# Patient Record
Sex: Female | Born: 1945
Health system: Southern US, Community
[De-identification: ages and names within clinical notes are randomized; demographics above are authoritative.]

## PROBLEM LIST (undated history)

## (undated) DIAGNOSIS — G4730 Sleep apnea, unspecified: Secondary | ICD-10-CM

## (undated) DIAGNOSIS — K2100 Gastro-esophageal reflux disease with esophagitis, without bleeding: Secondary | ICD-10-CM

## (undated) DIAGNOSIS — I10 Essential (primary) hypertension: Secondary | ICD-10-CM

## (undated) DIAGNOSIS — E785 Hyperlipidemia, unspecified: Secondary | ICD-10-CM

## (undated) DIAGNOSIS — C801 Malignant (primary) neoplasm, unspecified: Secondary | ICD-10-CM

## (undated) DIAGNOSIS — K219 Gastro-esophageal reflux disease without esophagitis: Secondary | ICD-10-CM

## (undated) DIAGNOSIS — E538 Deficiency of other specified B group vitamins: Secondary | ICD-10-CM

## (undated) DIAGNOSIS — K5190 Ulcerative colitis, unspecified, without complications: Secondary | ICD-10-CM

## (undated) DIAGNOSIS — T7840XA Allergy, unspecified, initial encounter: Secondary | ICD-10-CM

## (undated) HISTORY — PX: NASAL SINUS SURGERY: SHX719

## (undated) HISTORY — PX: COLONOSCOPY: SHX174

## (undated) HISTORY — PX: ABDOMINAL HYSTERECTOMY: SHX81

## (undated) HISTORY — PX: ESOPHAGOGASTRODUODENOSCOPY: SHX1529

## (undated) HISTORY — PX: CATARACT EXTRACTION: SUR2

## (undated) HISTORY — PX: SIGMOIDOSCOPY: SUR1295

## (undated) HISTORY — PX: POLYPECTOMY: SHX149

---

## 2004-01-27 ENCOUNTER — Ambulatory Visit: Payer: Self-pay | Admitting: Internal Medicine

## 2004-11-10 ENCOUNTER — Ambulatory Visit: Payer: Self-pay | Admitting: Unknown Physician Specialty

## 2009-07-15 ENCOUNTER — Ambulatory Visit: Payer: Self-pay | Admitting: Internal Medicine

## 2010-04-07 ENCOUNTER — Ambulatory Visit: Payer: Self-pay | Admitting: Unknown Physician Specialty

## 2010-04-10 LAB — PATHOLOGY REPORT

## 2010-05-02 ENCOUNTER — Other Ambulatory Visit: Payer: Self-pay | Admitting: Unknown Physician Specialty

## 2010-07-21 ENCOUNTER — Ambulatory Visit: Payer: Self-pay | Admitting: Internal Medicine

## 2011-08-17 ENCOUNTER — Ambulatory Visit: Payer: Self-pay | Admitting: Internal Medicine

## 2011-10-18 ENCOUNTER — Observation Stay: Payer: Self-pay | Admitting: Internal Medicine

## 2011-10-18 LAB — CK TOTAL AND CKMB (NOT AT ARMC)
CK, Total: 99 U/L (ref 21–215)
CK-MB: 1.6 ng/mL (ref 0.5–3.6)

## 2011-10-18 LAB — URINALYSIS, COMPLETE
Bacteria: NONE SEEN
Bilirubin,UR: NEGATIVE
Glucose,UR: NEGATIVE mg/dL (ref 0–75)
Leukocyte Esterase: NEGATIVE
Protein: NEGATIVE
RBC,UR: 1 /HPF (ref 0–5)
Squamous Epithelial: <1
WBC UR: <1 /HPF (ref 0–5)

## 2011-10-18 LAB — COMPREHENSIVE METABOLIC PANEL
Albumin: 3.6 g/dL (ref 3.4–5.0)
Alkaline Phosphatase: 57 U/L (ref 50–136)
BUN: 9 mg/dL (ref 7–18)
Bilirubin,Total: 0.4 mg/dL (ref 0.2–1.0)
Calcium, Total: 8.9 mg/dL (ref 8.5–10.1)
Chloride: 98 mmol/L (ref 98–107)
Creatinine: 0.7 mg/dL (ref 0.60–1.30)
EGFR (African American): >60
Glucose: 105 mg/dL — ABNORMAL HIGH (ref 65–99)
Potassium: 3 mmol/L — ABNORMAL LOW (ref 3.5–5.1)
SGOT(AST): 23 U/L (ref 15–37)
SGPT (ALT): 16 U/L (ref 12–78)
Sodium: 135 mmol/L — ABNORMAL LOW (ref 136–145)
Total Protein: 7.1 g/dL (ref 6.4–8.2)

## 2011-10-18 LAB — CBC
HCT: 37.5 % (ref 35.0–47.0)
HGB: 13.3 g/dL (ref 12.0–16.0)
MCH: 32.3 pg (ref 26.0–34.0)
MCHC: 35.4 g/dL (ref 32.0–36.0)
MCV: 91 fL (ref 80–100)
RDW: 12.5 % (ref 11.5–14.5)

## 2011-10-18 LAB — MAGNESIUM: Magnesium: 1.7 mg/dL — ABNORMAL LOW

## 2011-10-18 LAB — TROPONIN I: Troponin-I: <0.02 ng/mL

## 2011-10-19 LAB — HEMOGLOBIN A1C: Hemoglobin A1C: 5.7 %

## 2011-10-19 LAB — CBC WITH DIFFERENTIAL/PLATELET
Basophil #: 0.1 10*3/uL (ref 0.0–0.1)
Basophil %: 0.7 %
Eosinophil #: 0.1 10*3/uL (ref 0.0–0.7)
Eosinophil %: 1 %
Lymphocyte #: 1.5 10*3/uL (ref 1.0–3.6)
Lymphocyte %: 19 %
MCH: 31.2 pg (ref 26.0–34.0)
MCHC: 34 g/dL (ref 32.0–36.0)
MCV: 92 fL (ref 80–100)
Platelet: 258 10*3/uL (ref 150–440)
RBC: 4.14 10*6/uL (ref 3.80–5.20)
RDW: 12.6 % (ref 11.5–14.5)

## 2011-10-19 LAB — LIPID PANEL
Cholesterol: 184 mg/dL (ref 0–200)
HDL Cholesterol: 72 mg/dL — ABNORMAL HIGH (ref 40–60)
Ldl Cholesterol, Calc: 102 mg/dL — ABNORMAL HIGH (ref 0–100)
Triglycerides: 48 mg/dL (ref 0–200)

## 2011-10-19 LAB — TROPONIN I
Troponin-I: 0.09 ng/mL — ABNORMAL HIGH
Troponin-I: 0.04 ng/mL

## 2011-10-19 LAB — COMPREHENSIVE METABOLIC PANEL
Albumin: 3.2 g/dL — ABNORMAL LOW (ref 3.4–5.0)
Alkaline Phosphatase: 51 U/L (ref 50–136)
BUN: 8 mg/dL (ref 7–18)
Bilirubin,Total: 0.5 mg/dL (ref 0.2–1.0)
Co2: 30 mmol/L (ref 21–32)
Creatinine: 0.73 mg/dL (ref 0.60–1.30)
EGFR (African American): >60
EGFR (Non-African Amer.): >60
Glucose: 101 mg/dL — ABNORMAL HIGH (ref 65–99)
Osmolality: 272 (ref 275–301)
Potassium: 3.7 mmol/L (ref 3.5–5.1)
SGPT (ALT): 15 U/L (ref 12–78)
Total Protein: 6.7 g/dL (ref 6.4–8.2)

## 2011-10-19 LAB — CK TOTAL AND CKMB (NOT AT ARMC)
CK, Total: 78 U/L
CK, Total: 82 U/L
CK-MB: 2.1 ng/mL
CK-MB: 1.6 ng/mL

## 2011-10-19 LAB — PROTIME-INR: Prothrombin Time: 13.2 secs (ref 11.5–14.7)

## 2012-08-22 ENCOUNTER — Ambulatory Visit: Payer: Self-pay | Admitting: Internal Medicine

## 2013-07-31 DIAGNOSIS — K2100 Gastro-esophageal reflux disease with esophagitis, without bleeding: Secondary | ICD-10-CM | POA: Insufficient documentation

## 2013-07-31 DIAGNOSIS — K5190 Ulcerative colitis, unspecified, without complications: Secondary | ICD-10-CM | POA: Insufficient documentation

## 2013-08-28 ENCOUNTER — Ambulatory Visit: Payer: Self-pay | Admitting: Internal Medicine

## 2014-02-11 DIAGNOSIS — E538 Deficiency of other specified B group vitamins: Secondary | ICD-10-CM | POA: Insufficient documentation

## 2014-04-28 ENCOUNTER — Telehealth: Payer: Self-pay | Admitting: *Deleted

## 2014-04-28 NOTE — Telephone Encounter (Signed)
Patient called needing information on repairing her orthotics.  I explained that there is a $90 charge to refurbish she stated that Dr Milinda Pointer told her that they should last 4-5 years and that it has only been around 2.  I explained that we all wear differently and some are much harder than others so it is not unusual for the coverings to wear at a different rate the actual support itself should last as Dr Milinda Pointer stated.  She said it is not wear that the bottom is actually coming off and she was considering gluing it back on, I told her that did not sound like normal wear and tear.  I reccommended she bring the orthotics in for Dr Milinda Pointer to examine and see if it is a manufactures defect.  Either way she would like to purchase a pair of power steps size 9 to wear while her custom molded are being repaired.  She will go to the [place] office at her convenience.

## 2014-05-04 DIAGNOSIS — R52 Pain, unspecified: Secondary | ICD-10-CM

## 2014-05-19 ENCOUNTER — Telehealth: Payer: Self-pay | Admitting: Podiatry

## 2014-05-19 NOTE — Telephone Encounter (Signed)
Spoke with patient about orthos. Patient knows that recover costs $90. Will pick up orthos Friday

## 2014-05-25 DIAGNOSIS — M722 Plantar fascial fibromatosis: Secondary | ICD-10-CM

## 2014-06-29 NOTE — Consult Note (Signed)
PATIENT NAME:  Patricia Haynes, Patricia Haynes MR#:  [id number] DATE OF BIRTH:  1945/10/11  DATE OF CONSULTATION:  10/19/2011  REFERRING PHYSICIAN:  Dr. Allyson Sabal CONSULTING PHYSICIAN:  Rudell Cobb. Loletta Specter, MD  HISTORY:   Ms. Manzer is a 69 year old right-handed white patient of Dr. Emily Filbert with history of ulcerative colitis, hypertension, allergic rhinitis, and acid reflux who was admitted 10/18/2011 and is referred for evaluation of facial droop. History comes from the patient and from her hospital chart.   The patient presented to the emergency room at 6 p.m. on 10/18/2011 from [facility] where she had been directed with regard to recent visual symptoms. She reports that she was in her usual state of health until the afternoon of 10/17/2011 when she had the onset of right eye irritation. At suppertime she noted change of taste, more bland on the right side. At about 9:30 p.m. while flossing her teeth and rinsing her mouth, she noted leak of water from the right side of her mouth and asymmetry of her face in the mirror. Her symptoms persisted on the eighth  so that she contacted Dr. Ammie Ferrier office who directed her to be seen at Urgent Care or the ER, eventually resulting in ER evaluation and admission. She reports today that her symptoms have persisted and that weakness of the right side of the face is perhaps a little worse than it was on the eighth. She denies noting change of her hearing. She denied headache but then reported that she had some aching pain below and behind the right ear this morning. She has had no prior similar symptoms. She denies history of episodes in the past suggestive of stroke or temporary stroke. She denied any seizure or meningitis and does not have  migraine headaches.   EXAMINATION: The patient is a well-developed, well-nourished white woman who was pleasant and cooperative in no apparent distress with blood pressure semisupine 140/80 and heart rate 58. There was no fever. She was  normocephalic without evidence of trauma and her neck was supple. Mental status was normal including clear speech and normal expression. She was lucid and a good historian with normal affect.   On cranial nerve examination, she was seen to have moderate or moderate to severe lower motor neuron right facial weakness. She was able to just fully cover the globe of the right eye when asked to close her eyes firmly. Facial sensation was normal. Eye movements were normal. Visual fields were full to finger count for each eye; visual acuity was not tested.  Hearing acuity was normal to finger rubs bilaterally. There was hyperacusis of the right ear when she was tested with loud hand clap. Examination of the oropharynx was normal including midline uvula elevation with phonation and appearance of tongue and tongue movement.   Motor examination of the extremities showed normal tone and bulk throughout and symmetric full power throughout. Extremity coordination was normal as was extremity sensory exam. Reflexes were symmetric and rated 1+ throughout.   IMPRESSION: Clinical picture is most consistent with idiopathic Bell's palsy.   RECOMMENDATIONS:  1. Consider prednisone taper.  2. The patient is instructed in taping the right eyelid closed at night.  3. I do not see indication for lumbar puncture.   I appreciate being asked to see this pleasant and interesting lady.    ____________________________ Rudell Cobb. Loletta Specter, MD prc:bjt D: 10/19/2011 15:52:10 ET T: 10/19/2011 16:42:35 ET JOB#: [id number]  cc: Rudell Cobb. Loletta Specter, MD, <Dictator> Linton Flemings MD  ELECTRONICALLY SIGNED 10/25/2011 13:14

## 2014-06-29 NOTE — Discharge Summary (Signed)
PATIENT NAME:  Patricia Haynes, Patricia Haynes MR#:  [id number] DATE OF BIRTH:  02-24-1946  DATE OF ADMISSION:  10/18/2011 DATE OF DISCHARGE:  10/19/2011  DISCHARGE DIAGNOSES:  1. Malignant hypertension.  2. Bell's palsy.  3. Malignant hypertension. 4. Ulcerative colitis flare.   DISCHARGE MEDICATIONS:  1. Prednisone taper.  2. Omeprazole 20 mg daily.  3. Losartan/HCT 100/25 mg daily.  4. Potassium chloride 10 mEq daily.  5. Delzicol 400 mg three times daily. 6. Aspirin 81 mg daily.  7. Premarin 0.625 mg every other day.  8. Vitamin D3 daily.  9. Alprazolam 0.5 mg daily p.r.n.  10. Pravastatin 40 mg at bedtime.  11. Clonazepam 0.5 mg at bedtime.   REASON FOR ADMISSION: This is a 69 year old female who presents with right facial droop and elevated blood pressures. Please see the History and Physical for history of present illness, past medical history, and physical exam.   HOSPITAL COURSE: The patient was admitted and it became more apparent that she had Bell's palsy. Brain MRI was unremarkable. Carotid Doppler was unremarkable. She received Solu-Medrol x1 and will be on a prednisone taper with follow-up with Dr. Sabra Heck early on Tuesday of       this coming week. Blood pressure controlled with IV hydralazine. She can double up her Losartan if her blood pressure were to go up at home. Overall prognosis is good. The prednisone will help her ulcerative colitis flares as well.   ____________________________ Rusty Aus, MD mfm:slb D: 10/19/2011 17:28:17 ET T: 10/20/2011 15:09:47 ET JOB#: [id number]  cc: Rusty Aus, MD, <Dictator> MARK Roselee Culver MD ELECTRONICALLY SIGNED 10/22/2011 8:18

## 2014-06-29 NOTE — H&P (Signed)
PATIENT NAME:  Patricia Haynes, Patricia Haynes MR#:  [id number] DATE OF BIRTH:  30-Dec-1945  DATE OF ADMISSION:  10/18/2011  PRIMARY CARE PHYSICIAN: Dr. Emily Filbert  CHIEF COMPLAINT: Facial droop.   SUBJECTIVE: This is a 69 year old female with history of hypertension who presents with right-sided facial droop. The patient has been having some tearing and watering of her eyes and she has been unable to blink her right eye. She developed these symptoms in the afternoon. She also developed some blurry vision. No slurred speech. No unilateral weakness. No dysarthria. No dysphagia. She reportedly also had some fluttering sensation in her chest and was found to be fairly hypertensive with systolic blood pressure greater than 210 when she presented to the ER.   PAST MEDICAL HISTORY:  1. Hypertension.  2. Gastroesophageal reflux disease.  3. Ulcerative colitis.  4. Dyslipidemia.  5. Vitamin D deficiency.   PAST SURGICAL HISTORY:  1. History of colonoscopy.  2. Hysterectomy.   ALLERGIES: Codeine and latex.   SOCIAL HISTORY: The patient denies any history of smoking. Drinks occasionally and lives with her husband.   FAMILY HISTORY: Positive for mother having stroke as well as heart problems. Father had history of ulcerative colitis and colon cancer.   HOME MEDICATIONS:  1. Omeprazole 20 mg twice a day. 2. Valsartan/HCTZ 100/25, 1 tablet p.o. daily.  3. Vitamin B12 2000 mcg daily. 4. Klor-Con 10 mEq tablet daily.  5. Aspirin 81 mg p.o. daily.  6. Rowasa enema at night as needed. 7. Claritin 10 mg p.o. daily. 8. Fish oil 1000 mg 2 tablets daily. 9. Premarin every other day 0.625 mg. 10. Vitamin D3 1000 international units daily.  11. Vicodin 1 tablet every 4 to 6 hours as needed. 12. Xanax 0.5 mg 1/2 tablet as needed.  13. Pravastatin 40 mg p.o. daily.   REVIEW OF SYSTEMS: CONSTITUTIONAL: No fever, fatigue, weakness, pain, weight loss, weight gain. EYES: Positive for blurry vision. No inflammation,  glaucoma, cataracts. ENT: Negative for tinnitus, ear pain, hearing loss, seasonal allergies, epistaxis. RESPIRATORY: No cough, wheezing, hemoptysis, dyspnea, asthma, painful respiration or chronic obstructive pulmonary disease. CARDIOVASCULAR: No chest pain, orthopnea, edema, arrhythmia, palpitations, syncope. GASTROINTESTINAL: No nausea, vomiting, diarrhea, abdominal pain, hematemesis, melena, gastroesophageal reflux disease. GENITOURINARY: No dysuria, hematuria, renal calculi, frequency. ENDOCRINE: No polyuria, nocturia, thyroid problems, increased sweating, heat or cold intolerance. MUSCULOSKELETAL: Right shoulder pain due to rotator cuff tear and left foot pain because of Charcot's arthropathy, otherwise no neck, back or knee pain. NEUROLOGICAL: Weakness and facial asymmetry. No ataxia. No migraine headaches. No history of seizure disorder, transient ischemic attack or CVA in the past. PSYCHIATRIC: No anxiety, insomnia, bipolar disorder.   PHYSICAL EXAMINATION:  VITAL SIGNS: Blood pressure 210/101 upon presentation, respirations 18, pulse 74, temperature 98.4.   GENERAL: Currently comfortable, in no acute cardiopulmonary distress, alert and oriented x3.   NECK: Supple. No JVD. No carotid bruit. Right facial droop noted with ptosis of the right eyelid. No swollen glands.   LUNGS: Clear to auscultation bilaterally. No wheezes or crackles or rhonchi. No accessory muscle use.   CARDIOVASCULAR: Regular rate and rhythm. No murmurs, rubs, or gallops. PMI is nondisplaced.   ABDOMEN: Soft, nontender, nondistended.   EXTREMITIES: Without cyanosis, clubbing, or edema.   NEUROLOGIC: Cranial nerves II through XII appear to be grossly intact except for facial droop on the right associated with right side ptosis. Deep tendon reflexes 2+ bilaterally. Motor strength intact 5/5 in bilateral upper and lower extremities.   PSYCHIATRIC: Appropriate mood  and affect.   LYMPHATIC: No axillary, inguinal, or cervical  lymphadenopathy.   SKIN: Without any skin rashes.   LABORATORY, DIAGNOSTIC AND RADIOLOGICAL DATA: WBC 8.4, hemoglobin 13.3, hematocrit 37.5, platelet count 264, troponin less than 0.02, glucose 105, BUN 9, creatinine 0.7, sodium 135, potassium 3.0, chloride 98, bicarbonate 27, calcium 8.9, total bilirubin 0.5, alkaline phosphatase 57, ALT 16, AST 23, total protein 7.1. CT of the head without contrast shows no intracranial hemorrhage, small age indeterminate lacunar infarct in the anterior limb of the left internal capsule. Further evaluation could be provided with MRI is indicated. CT scan under estimates for ischemia in the first 24 hours.   ASSESSMENT:  1. Right-sided facial droop, transient ischemic attack versus Bell's palsy.  2. Hypertensive urgency.  3. Dyslipidemia.  4. Ulcerative colitis, currently stable.   PLAN:  1. The patient will be admitted to telemetry.  2. Will start a full stroke work-up including an MRI of the brain.  3. The patient is already on aspirin. She will need to be switched to Plavix indeed if she has had a stroke.  4. It is unclear to me if the patient has Bell's palsy or not therefore neurology consultation will be obtained in the morning.  5. We will discontinue Premarin for good given the patient's multiple cardiovascular risk factors. This has been explained to the patient.  6. Her ulcerative colitis is currently stable.  7. Will continue valsartan/HCTZ for her blood pressure and use p.r.n. hydralazine as needed for improved blood pressure control. Lovenox for deep vein thrombosis prophylaxis.  8. CODE STATUS: She is a FULL CODE.   TIME SPENT: 70 minutes.   ____________________________ Reyne Dumas, MD na:cms D: 10/18/2011 21:13:28 ET T: 10/19/2011 05:50:51 ET JOB#: [id number]  cc: Reyne Dumas, MD, <Dictator> Rusty Aus, MD  Reyne Dumas MD ELECTRONICALLY SIGNED 10/19/2011 19:10

## 2014-07-20 ENCOUNTER — Telehealth: Payer: Self-pay | Admitting: Podiatry

## 2014-07-20 NOTE — Telephone Encounter (Signed)
Called patient left her a message to go ahead and schedule an appointment with dr Milinda Pointer if feet are bothering her more, her orthotics were only sent out to be recovered it should not be making her feet worse

## 2014-07-20 NOTE — Telephone Encounter (Signed)
PT CALLED ASKING IF A NURSE COULD CALL HER BACK SHE IS HAVING ISSUES WITH HER REPAIRED ORTHOTICS NOT FITTING PROPERLY. SHE SAID HER FEET ARE HURTING MORE NOW THAN BEFORE.

## 2014-08-19 ENCOUNTER — Other Ambulatory Visit: Payer: Self-pay | Admitting: Internal Medicine

## 2014-08-19 DIAGNOSIS — Z1231 Encounter for screening mammogram for malignant neoplasm of breast: Secondary | ICD-10-CM

## 2014-09-02 ENCOUNTER — Ambulatory Visit
Admission: RE | Admit: 2014-09-02 | Discharge: 2014-09-02 | Disposition: A | Discharge status: Home / Self Care | Payer: Medicare Other | Source: Ambulatory Visit | Attending: Internal Medicine | Admitting: Internal Medicine

## 2014-09-02 DIAGNOSIS — Z1231 Encounter for screening mammogram for malignant neoplasm of breast: Secondary | ICD-10-CM | POA: Diagnosis not present

## 2014-09-02 HISTORY — DX: Malignant (primary) neoplasm, unspecified: C80.1

## 2014-12-13 ENCOUNTER — Other Ambulatory Visit
Admission: RE | Admit: 2014-12-13 | Discharge: 2014-12-13 | Disposition: A | Discharge status: Home / Self Care | Payer: Medicare Other | Source: Ambulatory Visit | Attending: Nurse Practitioner | Admitting: Nurse Practitioner

## 2014-12-13 DIAGNOSIS — R197 Diarrhea, unspecified: Secondary | ICD-10-CM | POA: Insufficient documentation

## 2014-12-13 LAB — C DIFFICILE QUICK SCREEN W PCR REFLEX
C Diff antigen: NEGATIVE
C Diff interpretation: NEGATIVE
C Diff toxin: NEGATIVE

## 2014-12-14 ENCOUNTER — Other Ambulatory Visit
Admission: RE | Admit: 2014-12-14 | Discharge: 2014-12-14 | Disposition: A | Discharge status: Home / Self Care | Payer: Medicare Other | Source: Other Acute Inpatient Hospital | Attending: Nurse Practitioner | Admitting: Nurse Practitioner

## 2014-12-14 DIAGNOSIS — R197 Diarrhea, unspecified: Secondary | ICD-10-CM | POA: Insufficient documentation

## 2014-12-17 LAB — O&P RESULT

## 2014-12-17 LAB — GIARDIA, EIA; OVA/PARASITE: Giardia Ag, Stl: NEGATIVE

## 2014-12-18 LAB — STOOL CULTURE

## 2015-09-05 ENCOUNTER — Other Ambulatory Visit: Payer: Self-pay | Admitting: Internal Medicine

## 2015-09-05 DIAGNOSIS — Z1231 Encounter for screening mammogram for malignant neoplasm of breast: Secondary | ICD-10-CM

## 2015-09-23 ENCOUNTER — Ambulatory Visit
Admission: RE | Admit: 2015-09-23 | Discharge: 2015-09-23 | Disposition: A | Discharge status: Home / Self Care | Payer: Medicare Other | Source: Ambulatory Visit | Attending: Internal Medicine | Admitting: Internal Medicine

## 2015-09-23 ENCOUNTER — Other Ambulatory Visit: Payer: Self-pay | Admitting: Internal Medicine

## 2015-09-23 DIAGNOSIS — Z1231 Encounter for screening mammogram for malignant neoplasm of breast: Secondary | ICD-10-CM | POA: Insufficient documentation

## 2016-01-24 ENCOUNTER — Other Ambulatory Visit (HOSPITAL_COMMUNITY): Payer: Self-pay | Admitting: Unknown Physician Specialty

## 2016-01-24 ENCOUNTER — Other Ambulatory Visit: Payer: Self-pay | Admitting: Unknown Physician Specialty

## 2016-01-24 DIAGNOSIS — M1711 Unilateral primary osteoarthritis, right knee: Secondary | ICD-10-CM

## 2016-02-07 ENCOUNTER — Ambulatory Visit
Admission: RE | Admit: 2016-02-07 | Discharge: 2016-02-07 | Disposition: A | Discharge status: Home / Self Care | Payer: Medicare Other | Source: Ambulatory Visit | Attending: Unknown Physician Specialty | Admitting: Unknown Physician Specialty

## 2016-02-07 DIAGNOSIS — X58XXXA Exposure to other specified factors, initial encounter: Secondary | ICD-10-CM | POA: Insufficient documentation

## 2016-02-07 DIAGNOSIS — M25461 Effusion, right knee: Secondary | ICD-10-CM | POA: Insufficient documentation

## 2016-02-07 DIAGNOSIS — S83231A Complex tear of medial meniscus, current injury, right knee, initial encounter: Secondary | ICD-10-CM | POA: Diagnosis not present

## 2016-02-07 DIAGNOSIS — M1711 Unilateral primary osteoarthritis, right knee: Secondary | ICD-10-CM | POA: Diagnosis present

## 2016-02-07 DIAGNOSIS — M7051 Other bursitis of knee, right knee: Secondary | ICD-10-CM | POA: Diagnosis not present

## 2016-02-07 DIAGNOSIS — R937 Abnormal findings on diagnostic imaging of other parts of musculoskeletal system: Secondary | ICD-10-CM | POA: Insufficient documentation

## 2016-08-16 DIAGNOSIS — E782 Mixed hyperlipidemia: Secondary | ICD-10-CM | POA: Insufficient documentation

## 2016-09-04 DIAGNOSIS — M1712 Unilateral primary osteoarthritis, left knee: Secondary | ICD-10-CM | POA: Insufficient documentation

## 2016-09-07 DIAGNOSIS — Z0000 Encounter for general adult medical examination without abnormal findings: Secondary | ICD-10-CM | POA: Insufficient documentation

## 2016-11-09 DIAGNOSIS — G4733 Obstructive sleep apnea (adult) (pediatric): Secondary | ICD-10-CM | POA: Insufficient documentation

## 2016-11-09 DIAGNOSIS — Z9989 Dependence on other enabling machines and devices: Secondary | ICD-10-CM

## 2016-12-24 DIAGNOSIS — I10 Essential (primary) hypertension: Secondary | ICD-10-CM | POA: Insufficient documentation

## 2017-01-18 ENCOUNTER — Encounter: Payer: Self-pay | Admitting: *Deleted

## 2017-01-18 ENCOUNTER — Ambulatory Visit: Payer: Medicare Other | Admitting: Anesthesiology

## 2017-01-18 ENCOUNTER — Encounter
Admission: RE | Disposition: A | Discharge status: Home / Self Care | Payer: Self-pay | Source: Ambulatory Visit | Attending: Unknown Physician Specialty

## 2017-01-18 ENCOUNTER — Ambulatory Visit
Admission: RE | Admit: 2017-01-18 | Discharge: 2017-01-18 | Disposition: A | Discharge status: Home / Self Care | Payer: Medicare Other | Source: Ambulatory Visit | Attending: Unknown Physician Specialty | Admitting: Unknown Physician Specialty

## 2017-01-18 DIAGNOSIS — K5150 Left sided colitis without complications: Secondary | ICD-10-CM | POA: Insufficient documentation

## 2017-01-18 DIAGNOSIS — K621 Rectal polyp: Secondary | ICD-10-CM | POA: Insufficient documentation

## 2017-01-18 DIAGNOSIS — D122 Benign neoplasm of ascending colon: Secondary | ICD-10-CM | POA: Diagnosis not present

## 2017-01-18 DIAGNOSIS — K219 Gastro-esophageal reflux disease without esophagitis: Secondary | ICD-10-CM | POA: Insufficient documentation

## 2017-01-18 DIAGNOSIS — K640 First degree hemorrhoids: Secondary | ICD-10-CM | POA: Insufficient documentation

## 2017-01-18 DIAGNOSIS — Z7982 Long term (current) use of aspirin: Secondary | ICD-10-CM | POA: Diagnosis not present

## 2017-01-18 DIAGNOSIS — E785 Hyperlipidemia, unspecified: Secondary | ICD-10-CM | POA: Diagnosis not present

## 2017-01-18 DIAGNOSIS — Z872 Personal history of diseases of the skin and subcutaneous tissue: Secondary | ICD-10-CM | POA: Diagnosis not present

## 2017-01-18 DIAGNOSIS — G4730 Sleep apnea, unspecified: Secondary | ICD-10-CM | POA: Insufficient documentation

## 2017-01-18 DIAGNOSIS — Z79899 Other long term (current) drug therapy: Secondary | ICD-10-CM | POA: Diagnosis not present

## 2017-01-18 DIAGNOSIS — I10 Essential (primary) hypertension: Secondary | ICD-10-CM | POA: Diagnosis not present

## 2017-01-18 DIAGNOSIS — Z87891 Personal history of nicotine dependence: Secondary | ICD-10-CM | POA: Insufficient documentation

## 2017-01-18 DIAGNOSIS — Z09 Encounter for follow-up examination after completed treatment for conditions other than malignant neoplasm: Secondary | ICD-10-CM | POA: Insufficient documentation

## 2017-01-18 HISTORY — DX: Deficiency of other specified B group vitamins: E53.8

## 2017-01-18 HISTORY — DX: Essential (primary) hypertension: I10

## 2017-01-18 HISTORY — DX: Ulcerative colitis, unspecified, without complications: K51.90

## 2017-01-18 HISTORY — PX: COLONOSCOPY WITH PROPOFOL: SHX5780

## 2017-01-18 HISTORY — DX: Gastro-esophageal reflux disease with esophagitis, without bleeding: K21.00

## 2017-01-18 HISTORY — DX: Hyperlipidemia, unspecified: E78.5

## 2017-01-18 HISTORY — DX: Gastro-esophageal reflux disease without esophagitis: K21.9

## 2017-01-18 HISTORY — DX: Sleep apnea, unspecified: G47.30

## 2017-01-18 HISTORY — DX: Allergy, unspecified, initial encounter: T78.40XA

## 2017-01-18 HISTORY — DX: Gastro-esophageal reflux disease with esophagitis: K21.0

## 2017-01-18 SURGERY — COLONOSCOPY WITH PROPOFOL
Anesthesia: General

## 2017-01-18 MED ORDER — FENTANYL CITRATE (PF) 100 MCG/2ML IJ SOLN
INTRAMUSCULAR | Status: DC | PRN
  Administered 2017-01-18: 50 ug via INTRAVENOUS

## 2017-01-18 MED ORDER — PROPOFOL 500 MG/50ML IV EMUL
INTRAVENOUS | Status: DC | PRN
  Administered 2017-01-18: 100 ug/kg/min via INTRAVENOUS

## 2017-01-18 MED ORDER — ONDANSETRON HCL 4 MG/2ML IJ SOLN
INTRAMUSCULAR | Status: AC
  Filled 2017-01-18: qty 2

## 2017-01-18 MED ORDER — MIDAZOLAM HCL 2 MG/2ML IJ SOLN
INTRAMUSCULAR | Status: DC | PRN
  Administered 2017-01-18: 2 mg via INTRAVENOUS

## 2017-01-18 MED ORDER — MIDAZOLAM HCL 2 MG/2ML IJ SOLN
INTRAMUSCULAR | Status: AC
  Filled 2017-01-18: qty 2

## 2017-01-18 MED ORDER — FENTANYL CITRATE (PF) 100 MCG/2ML IJ SOLN
INTRAMUSCULAR | Status: AC
  Filled 2017-01-18: qty 2

## 2017-01-18 MED ORDER — SODIUM CHLORIDE 0.9 % IV SOLN: INTRAVENOUS | Status: DC

## 2017-01-18 MED ORDER — PROPOFOL 500 MG/50ML IV EMUL
INTRAVENOUS | Status: AC
  Filled 2017-01-18: qty 50

## 2017-01-18 MED ORDER — SODIUM CHLORIDE 0.9 % IV SOLN
INTRAVENOUS | Status: DC
  Administered 2017-01-18: 07:00:00 via INTRAVENOUS

## 2017-01-18 NOTE — Anesthesia Postprocedure Evaluation (Signed)
Anesthesia Post Note  Patient: SALINDA SNEDEKER  Procedure(s) Performed: COLONOSCOPY WITH PROPOFOL (N/A )  Patient location during evaluation: Endoscopy Anesthesia Type: General Level of consciousness: awake and alert Pain management: pain level controlled Vital Signs Assessment: post-procedure vital signs reviewed and stable Respiratory status: spontaneous breathing, nonlabored ventilation, respiratory function stable and patient connected to nasal cannula oxygen Cardiovascular status: blood pressure returned to baseline and stable Postop Assessment: no apparent nausea or vomiting Anesthetic complications: no     Last Vitals:  Vitals:   01/18/17 0812 01/18/17 0825  BP: (!) 111/56 127/83  Pulse: (!) 55   Resp: 14   Temp: (!) 36.3 C   SpO2: 100%     Last Pain:  Vitals:   01/18/17 0812  TempSrc: Tympanic                 Martha Clan

## 2017-01-18 NOTE — Anesthesia Procedure Notes (Signed)
Performed by: Cook-Martin, Brittie Whisnant Pre-anesthesia Checklist: Patient identified, Emergency Drugs available, Suction available, Patient being monitored and Timeout performed Patient Re-evaluated:Patient Re-evaluated prior to induction Oxygen Delivery Method: Nasal cannula Preoxygenation: Pre-oxygenation with 100% oxygen Induction Type: IV induction Placement Confirmation: positive ETCO2 and CO2 detector       

## 2017-01-18 NOTE — Transfer of Care (Signed)
Immediate Anesthesia Transfer of Care Note  Patient: Patricia Haynes  Procedure(s) Performed: COLONOSCOPY WITH PROPOFOL (N/A )  Patient Location: PACU  Anesthesia Type:General  Level of Consciousness: awake and sedated  Airway & Oxygen Therapy: Patient connected to face mask oxygen  Post-op Assessment: Report given to RN and Post -op Vital signs reviewed and stable  Post vital signs: Reviewed and stable  Last Vitals:  Vitals:   01/18/17 0651  BP: (!) 152/92  Pulse: 82  Resp: 18  Temp: (!) 36.2 C  SpO2: 98%    Last Pain:  Vitals:   01/18/17 0651  TempSrc: Oral         Complications: No apparent anesthesia complications

## 2017-01-18 NOTE — Anesthesia Preprocedure Evaluation (Signed)
Anesthesia Evaluation  Patient identified by MRN, date of birth, ID band Patient awake    Reviewed: Allergy & Precautions, H&P , NPO status , Patient's Chart, lab work & pertinent test results, reviewed documented beta blocker date and time   History of Anesthesia Complications (+) PONV and history of anesthetic complications  Airway Mallampati: I  TM Distance: >3 FB Neck ROM: full    Dental  (+) Caps, Dental Advidsory Given, Teeth Intact   Pulmonary neg shortness of breath, sleep apnea , neg COPD, Recent URI , Residual Cough, former smoker,           Cardiovascular Exercise Tolerance: Good hypertension, (-) angina(-) CAD, (-) Past MI, (-) Cardiac Stents and (-) CABG (-) dysrhythmias (-) Valvular Problems/Murmurs     Neuro/Psych negative neurological ROS  negative psych ROS   GI/Hepatic Neg liver ROS, PUD, GERD  ,  Endo/Other  negative endocrine ROS  Renal/GU negative Renal ROS  negative genitourinary   Musculoskeletal   Abdominal   Peds  Hematology negative hematology ROS (+)   Anesthesia Other Findings Past Medical History: No date: Allergic state No date: B12 deficiency No date: Cancer [facility])     Comment:  skin No date: GERD (gastroesophageal reflux disease) No date: Hyperlipidemia No date: Hypertension No date: Reflux esophagitis No date: Sleep apnea     Comment:  developed cough when using cpap and not using No date: Ulcerative colitis ([place])   Reproductive/Obstetrics negative OB ROS                             Anesthesia Physical Anesthesia Plan  ASA: II  Anesthesia Plan: General   Post-op Pain Management:    Induction: Intravenous  PONV Risk Score and Plan: Propofol infusion  Airway Management Planned: Nasal Cannula  Additional Equipment:   Intra-op Plan:   Post-operative Plan:   Informed Consent: I have reviewed the patients History and Physical, chart, labs  and discussed the procedure including the risks, benefits and alternatives for the proposed anesthesia with the patient or authorized representative who has indicated his/her understanding and acceptance.   Dental Advisory Given  Plan Discussed with: Anesthesiologist, CRNA and Surgeon  Anesthesia Plan Comments:         Anesthesia Quick Evaluation

## 2017-01-18 NOTE — Anesthesia Post-op Follow-up Note (Signed)
Anesthesia QCDR form completed.        

## 2017-01-18 NOTE — H&P (Signed)
Primary Care Physician:  Rusty Aus, MD Primary Gastroenterologist:  Dr. Vira Agar  Pre-Procedure History & Physical: HPI:  Patricia Haynes is a 71 y.o. female is here for an colonoscopy.   Past Medical History:  Diagnosis Date  . Allergic state   . B12 deficiency   . Cancer ([place])    skin  . GERD (gastroesophageal reflux disease)   . Hyperlipidemia   . Hypertension   . Reflux esophagitis   . Sleep apnea    developed cough when using cpap and not using  . Ulcerative colitis [facility])     Past Surgical History:  Procedure Laterality Date  . COLONOSCOPY    . ESOPHAGOGASTRODUODENOSCOPY    . NASAL SINUS SURGERY    . POLYPECTOMY    . SIGMOIDOSCOPY      Prior to Admission medications   Medication Sig Start Date End Date Taking? Authorizing Provider  ALPRAZolam Duanne Moron) 0.5 MG tablet Take 0.5 mg at bedtime as needed by mouth for anxiety.   Yes [provider]  amLODipine (NORVASC) 5 MG tablet Take 5 mg daily by mouth.   Yes [provider]  aspirin EC 81 MG tablet Take 81 mg daily by mouth.   Yes [provider]  azelastine (ASTELIN) 0.1 % nasal spray Place 2 (two) times daily into both nostrils. Use in each nostril as directed   Yes [provider]  Cholecalciferol 2000 units TABS Take 2,000 Units by mouth.   Yes [provider]  loratadine (CLARITIN) 10 MG tablet Take 10 mg daily by mouth.   Yes [provider]  losartan-hydrochlorothiazide (HYZAAR) 100-25 MG tablet Take 1 tablet daily by mouth.   Yes [provider]  omeprazole (PRILOSEC) 40 MG capsule Take 40 mg daily by mouth.   Yes [provider]  pravastatin (PRAVACHOL) 20 MG tablet Take 20 mg daily by mouth.   Yes [provider]    Allergies as of 11/13/2016  . (Not on File)    Family History  Problem Relation Age of Onset  . Breast cancer Neg Hx     Social History   Socioeconomic History  . Marital status: Widowed    Spouse  name: Not on file  . Number of children: Not on file  . Years of education: Not on file  . Highest education level: Not on file  Social Needs  . Financial resource strain: Not on file  . Food insecurity - worry: Not on file  . Food insecurity - inability: Not on file  . Transportation needs - medical: Not on file  . Transportation needs - non-medical: Not on file  Occupational History  . Not on file  Tobacco Use  . Smoking status: Former Research scientist (life sciences)  . Smokeless tobacco: Never Used  Substance and Sexual Activity  . Alcohol use: No    Frequency: Never  . Drug use: No  . Sexual activity: Not on file  Other Topics Concern  . Not on file  Social History Narrative  . Not on file    Review of Systems: See HPI, otherwise negative ROS  Physical Exam: BP (!) 152/92   Pulse 82   Temp (!) 97.1 F (36.2 C) (Oral)   Resp 18   Ht 5\' 3"  (1.6 m)   Wt 69.4 kg (153 lb)   SpO2 98%   BMI 27.10 kg/m  General:   Alert,  pleasant and cooperative in NAD Head:  Normocephalic and atraumatic. Neck:  Supple; no masses  or thyromegaly. Lungs:  Clear throughout to auscultation.    Heart:  Regular rate and rhythm. Abdomen:  Soft, nontender and nondistended. Normal bowel sounds, without guarding, and without rebound.   Neurologic:  Alert and  oriented x4;  grossly normal neurologically.  Impression/Plan: Patricia Haynes is here for an colonoscopy to be performed for follow up ulcerative colitis.  Risks, benefits, limitations, and alternatives regarding  colonoscopy have been reviewed with the patient.  Questions have been answered.  All parties agreeable.   Gaylyn Cheers, MD  01/18/2017, 7:34 AM

## 2017-01-18 NOTE — Op Note (Signed)
[facility] Gastroenterology Patient Name: Patricia Haynes Procedure Date: 01/18/2017 7:26 AM MRN: [id number] Account #: [id number] Date of Birth: 1945/06/15 Admit Type: Outpatient Age: 71 Room: [facility] ENDO ROOM 1 Gender: Female Note Status: Finalized Procedure:            Colonoscopy Indications:          High risk colon cancer surveillance: Ulcerative left                        sided colitis Providers:            Manya Silvas, MD Medicines:            Propofol per Anesthesia Complications:        No immediate complications. Procedure:            Pre-Anesthesia Assessment:                       - After reviewing the risks and benefits, the patient                        was deemed in satisfactory condition to undergo the                        procedure.                       After obtaining informed consent, the colonoscope was                        passed under direct vision. Throughout the procedure,                        the patient's blood pressure, pulse, and oxygen                        saturations were monitored continuously. The                        Colonoscope was introduced through the anus and                        advanced to the the cecum, identified by appendiceal                        orifice and ileocecal valve. The colonoscopy was                        performed without difficulty. The patient tolerated the                        procedure well. The quality of the bowel preparation                        was good. Findings:      A diminutive polyp was found in the proximal ascending colon. The polyp       was sessile. The polyp was removed with a jumbo cold forceps. Resection       and retrieval were complete.      A 4 mm polyp was found in the rectum. The polyp was sessile. The polyp  was removed with a hot snare. Resection and retrieval were complete.      Internal hemorrhoids were found during endoscopy. The hemorrhoids were        small and Grade I (internal hemorrhoids that do not prolapse).      The mucosa lining the distal colon was somewhat pale but no inflammation       or ulceration seen. This is the area where there was ulcerative colitis       at one time many years ago. Impression:           - One diminutive polyp in the proximal ascending colon,                        removed with a jumbo cold forceps. Resected and                        retrieved.                       - One 4 mm polyp in the rectum, removed with a hot                        snare. Resected and retrieved.                       - Internal hemorrhoids. Recommendation:       - Await pathology results. Manya Silvas, MD 01/18/2017 8:13:25 AM This report has been signed electronically. Number of Addenda: 0 Note Initiated On: 01/18/2017 7:26 AM Scope Withdrawal Time: 0 hours 13 minutes 22 seconds  Total Procedure Duration: 0 hours 28 minutes 19 seconds       [facility]

## 2017-01-21 ENCOUNTER — Encounter: Payer: Self-pay | Admitting: Unknown Physician Specialty

## 2017-01-21 LAB — SURGICAL PATHOLOGY

## 2017-02-14 DIAGNOSIS — D369 Benign neoplasm, unspecified site: Secondary | ICD-10-CM | POA: Insufficient documentation

## 2017-10-30 ENCOUNTER — Other Ambulatory Visit: Payer: Self-pay | Admitting: Internal Medicine

## 2017-10-30 DIAGNOSIS — Z1231 Encounter for screening mammogram for malignant neoplasm of breast: Secondary | ICD-10-CM

## 2017-11-14 ENCOUNTER — Encounter: Payer: Self-pay | Admitting: Podiatry

## 2017-11-14 ENCOUNTER — Ambulatory Visit: Payer: Medicare Other | Admitting: Podiatry

## 2017-11-14 DIAGNOSIS — M79675 Pain in left toe(s): Secondary | ICD-10-CM | POA: Diagnosis not present

## 2017-11-14 DIAGNOSIS — M79674 Pain in right toe(s): Secondary | ICD-10-CM

## 2017-11-14 DIAGNOSIS — B351 Tinea unguium: Secondary | ICD-10-CM

## 2017-11-14 NOTE — Progress Notes (Signed)
Complaint:  Visit Type: Patient presents  to my office for  preventative foot care services. Complaint: Patient states" my nails have grown long and thick and become painful to walk and wear shoes"  The patient presents for preventative foot care services. No changes to ROS  Podiatric Exam: Vascular: dorsalis pedis and posterior tibial pulses are palpable bilateral. Capillary return is immediate. Temperature gradient is WNL. Skin turgor WNL  Sensorium: Normal Semmes Weinstein monofilament test. Normal tactile sensation bilaterally. Nail Exam: Pt has thick disfigured discolored nails with subungual debris noted bilateral entire nail hallux through fifth toenails Ulcer Exam: There is no evidence of ulcer or pre-ulcerative changes or infection. Orthopedic Exam: Muscle tone and strength are WNL. No limitations in general ROM. No crepitus or effusions noted. Foot type and digits show no abnormalities. Bony enlargement at TNJ left foot.  Hammer toe second right. Skin: No Porokeratosis. No infection or ulcers  Diagnosis:  Onychomycosis, , Pain in right toe, pain in left toes  Treatment & Plan Procedures and Treatment: Consent by patient was obtained for treatment procedures.   Debridement of mycotic and hypertrophic toenails, 1 through 5 bilateral and clearing of subungual debris. No ulceration, no infection noted.  Return Visit-Office Procedure: Patient instructed to return to the office for a follow up visit 3 months for continued evaluation and treatment.    Bexley Mclester DPM 

## 2017-11-15 ENCOUNTER — Ambulatory Visit
Admission: RE | Admit: 2017-11-15 | Discharge: 2017-11-15 | Disposition: A | Discharge status: Home / Self Care | Payer: Medicare Other | Source: Ambulatory Visit | Attending: Internal Medicine | Admitting: Internal Medicine

## 2017-11-15 DIAGNOSIS — Z1231 Encounter for screening mammogram for malignant neoplasm of breast: Secondary | ICD-10-CM | POA: Diagnosis not present

## 2017-12-23 ENCOUNTER — Ambulatory Visit: Payer: Medicare Other | Admitting: Podiatry

## 2017-12-23 ENCOUNTER — Encounter: Payer: Self-pay | Admitting: Podiatry

## 2017-12-23 DIAGNOSIS — M2142 Flat foot [pes planus] (acquired), left foot: Secondary | ICD-10-CM

## 2017-12-23 DIAGNOSIS — M2141 Flat foot [pes planus] (acquired), right foot: Secondary | ICD-10-CM

## 2017-12-23 NOTE — Progress Notes (Signed)
Subjective:  Patient ID: Patricia Haynes, female    DOB: 1946/01/09,  MRN: [id number] HPI Chief Complaint  Patient presents with  . Foot Pain    Patient presents today to discuss orthotic and cramping in her feet in the mornings.  She states " they cramp mostly in the mornings and I wonder if its coming from the pain I have in my back and knees"    72 y.o. female presents with the above complaint.   ROS: Denies fever chills nausea vomiting muscle aches pains calf pain back pain chest pain shortness of breath.  Past Medical History:  Diagnosis Date  . Allergic state   . B12 deficiency   . Cancer ([place])    skin  . GERD (gastroesophageal reflux disease)   . Hyperlipidemia   . Hypertension   . Reflux esophagitis   . Sleep apnea    developed cough when using cpap and not using  . Ulcerative colitis [facility])    Past Surgical History:  Procedure Laterality Date  . COLONOSCOPY    . COLONOSCOPY WITH PROPOFOL N/A 01/18/2017   Procedure: COLONOSCOPY WITH PROPOFOL;  Surgeon: Manya Silvas, MD;  Location: [facility] ENDOSCOPY;  Service: Endoscopy;  Laterality: N/A;  . ESOPHAGOGASTRODUODENOSCOPY    . NASAL SINUS SURGERY    . POLYPECTOMY    . SIGMOIDOSCOPY      Current Outpatient Medications:  .  ALPRAZolam (XANAX) 0.5 MG tablet, Take 0.5 mg at bedtime as needed by mouth for anxiety., Disp: , Rfl:  .  amLODipine (NORVASC) 5 MG tablet, Take 5 mg daily by mouth., Disp: , Rfl:  .  aspirin EC 81 MG tablet, Take 81 mg daily by mouth., Disp: , Rfl:  .  azelastine (ASTELIN) 0.1 % nasal spray, Place 2 (two) times daily into both nostrils. Use in each nostril as directed, Disp: , Rfl:  .  Cholecalciferol 2000 units TABS, Take 2,000 Units by mouth., Disp: , Rfl:  .  Cyanocobalamin (VITAMIN B-12) 2500 MCG SUBL, Place under the tongue., Disp: , Rfl:  .  doxazosin (CARDURA) 4 MG tablet, , Disp: , Rfl: 2 .  loratadine (CLARITIN) 10 MG tablet, Take 10 mg daily by mouth., Disp: , Rfl:  .  meloxicam (MOBIC)  7.5 MG tablet, , Disp: , Rfl: 2 .  Omega-3 Fatty Acids (FISH OIL PO), Take by mouth., Disp: , Rfl:  .  omeprazole (PRILOSEC) 40 MG capsule, Take 40 mg daily by mouth., Disp: , Rfl:  .  pravastatin (PRAVACHOL) 20 MG tablet, Take 20 mg daily by mouth., Disp: , Rfl:  .  spironolactone (ALDACTONE) 50 MG tablet, , Disp: , Rfl: 2  Allergies  Allergen Reactions  . Codeine   . Etodolac Other (See Comments)    Heart races  . Latex    Review of Systems Objective:  There were no vitals filed for this visit.  General: Well developed, nourished, in no acute distress, alert and oriented x3   Dermatological: Skin is warm, dry and supple bilateral. Nails x 10 are well maintained; remaining integument appears unremarkable at this time. There are no open sores, no preulcerative lesions, no rash or signs of infection present.  Vascular: Dorsalis Pedis artery and Posterior Tibial artery pedal pulses are 2/4 bilateral with immedate capillary fill time. Pedal hair growth present. No varicosities and no lower extremity edema present bilateral.   Neruologic: Grossly intact via light touch bilateral. Vibratory intact via tuning fork bilateral. Protective threshold with Semmes Wienstein monofilament intact to  all pedal sites bilateral. Patellar and Achilles deep tendon reflexes 2+ bilateral. No Babinski or clonus noted bilateral.   Musculoskeletal: No gross boney pedal deformities bilateral. No pain, crepitus, or limitation noted with foot and ankle range of motion bilateral. Muscular strength 5/5 in all groups tested bilateral.  She has pain on range of motion of the midfoot left in particular osteoarthritic changes of the midfoot are present.  She also has knee pain Gait unstable and antalgic.   Radiographs:  No acute findings  Assessment & Plan:   Assessment: Unstable antalgic gait with osteoarthritis of the bilateral foot left greater than right.  Plan: She will follow-up with Liliane Channel for orthotics.   These need to be more of very soft functional orthotic.     Max T. [place], [place]

## 2018-01-08 ENCOUNTER — Ambulatory Visit: Payer: Medicare Other | Admitting: Orthotics

## 2018-01-08 DIAGNOSIS — B351 Tinea unguium: Secondary | ICD-10-CM

## 2018-01-08 DIAGNOSIS — M79675 Pain in left toe(s): Secondary | ICD-10-CM

## 2018-01-08 DIAGNOSIS — M2142 Flat foot [pes planus] (acquired), left foot: Principal | ICD-10-CM

## 2018-01-08 DIAGNOSIS — M2141 Flat foot [pes planus] (acquired), right foot: Secondary | ICD-10-CM

## 2018-01-08 DIAGNOSIS — M79674 Pain in right toe(s): Secondary | ICD-10-CM

## 2018-01-08 NOTE — Progress Notes (Signed)
Patient in for cmfo to address several issues including OA foot.  We will ck insurance to verify coverage (doubit it); however if not..she will be cast for mezzo braces.

## 2018-01-14 ENCOUNTER — Telehealth: Payer: Self-pay | Admitting: Orthotics

## 2018-01-14 NOTE — Telephone Encounter (Signed)
Talked with patient and advisd that [facility] Medicare will not cover foot orthotics, but would mezzo brace.  Did discuss that messo brace may be a little much due to other factors.  She agreed to go with CMFO and pay the cost.  Charges need to be dropped and she sign ABN when picking up f/o.  Ever to fab.

## 2018-01-22 ENCOUNTER — Ambulatory Visit: Payer: Medicare Other | Admitting: Orthotics

## 2018-01-22 DIAGNOSIS — B351 Tinea unguium: Secondary | ICD-10-CM

## 2018-01-22 DIAGNOSIS — M2142 Flat foot [pes planus] (acquired), left foot: Principal | ICD-10-CM

## 2018-01-22 DIAGNOSIS — M79675 Pain in left toe(s): Secondary | ICD-10-CM

## 2018-01-22 DIAGNOSIS — M2141 Flat foot [pes planus] (acquired), right foot: Secondary | ICD-10-CM

## 2018-01-22 DIAGNOSIS — M79674 Pain in right toe(s): Secondary | ICD-10-CM

## 2018-01-22 NOTE — Progress Notes (Signed)
Cast for [place] [employer] f/o to address PTTD left over right.  If these don't give optimal result, Mezzo

## 2018-02-13 ENCOUNTER — Ambulatory Visit: Payer: Medicare Other | Admitting: Podiatry

## 2018-02-13 ENCOUNTER — Encounter: Payer: Self-pay | Admitting: Podiatry

## 2018-02-13 DIAGNOSIS — B351 Tinea unguium: Secondary | ICD-10-CM | POA: Diagnosis not present

## 2018-02-13 DIAGNOSIS — M79675 Pain in left toe(s): Secondary | ICD-10-CM | POA: Diagnosis not present

## 2018-02-13 DIAGNOSIS — M79674 Pain in right toe(s): Secondary | ICD-10-CM

## 2018-02-13 NOTE — Progress Notes (Signed)
Complaint:  Visit Type: Patient presents  to my office for  preventative foot care services. Complaint: Patient states" my nails have grown long and thick and become painful to walk and wear shoes"  The patient presents for preventative foot care services. No changes to ROS  Podiatric Exam: Vascular: dorsalis pedis and posterior tibial pulses are palpable bilateral. Capillary return is immediate. Temperature gradient is WNL. Skin turgor WNL  Sensorium: Normal Semmes Weinstein monofilament test. Normal tactile sensation bilaterally. Nail Exam: Pt has thick disfigured discolored nails with subungual debris noted bilateral entire nail hallux through fifth toenails Ulcer Exam: There is no evidence of ulcer or pre-ulcerative changes or infection. Orthopedic Exam: Muscle tone and strength are WNL. No limitations in general ROM. No crepitus or effusions noted. Foot type and digits show no abnormalities. Bony enlargement at TNJ left foot.  Hammer toe second right. Skin: No Porokeratosis. No infection or ulcers  Diagnosis:  Onychomycosis, , Pain in right toe, pain in left toes  Treatment & Plan Procedures and Treatment: Consent by patient was obtained for treatment procedures.   Debridement of mycotic and hypertrophic toenails, 1 through 5 bilateral and clearing of subungual debris. No ulceration, no infection noted. Dispense orthoses. If she has problems with orthoses she should make an appointment with Liliane Channel. Return Visit-Office Procedure: Patient instructed to return to the office for a follow up visit 3 months for continued evaluation and treatment.    Gardiner Barefoot DPM

## 2018-05-15 ENCOUNTER — Ambulatory Visit: Payer: Medicare Other | Admitting: Podiatry

## 2018-05-19 ENCOUNTER — Ambulatory Visit: Payer: Medicare Other | Admitting: Podiatry

## 2018-05-21 ENCOUNTER — Ambulatory Visit: Payer: Self-pay | Admitting: Orthotics

## 2018-05-21 ENCOUNTER — Other Ambulatory Visit: Payer: Self-pay

## 2018-05-21 DIAGNOSIS — M2141 Flat foot [pes planus] (acquired), right foot: Secondary | ICD-10-CM

## 2018-05-21 DIAGNOSIS — M2142 Flat foot [pes planus] (acquired), left foot: Principal | ICD-10-CM

## 2018-05-21 NOTE — Progress Notes (Signed)
Adjusted f/o by removing forefoot and making just 3/4 length.

## 2018-06-05 ENCOUNTER — Ambulatory Visit: Payer: Medicare Other | Admitting: Podiatry

## 2018-07-07 ENCOUNTER — Ambulatory Visit: Payer: Self-pay | Admitting: Podiatry

## 2018-07-24 ENCOUNTER — Encounter: Payer: Self-pay | Admitting: Podiatry

## 2018-07-24 ENCOUNTER — Other Ambulatory Visit: Payer: Self-pay

## 2018-07-24 ENCOUNTER — Ambulatory Visit (INDEPENDENT_AMBULATORY_CARE_PROVIDER_SITE_OTHER): Payer: Medicare Other | Admitting: Podiatry

## 2018-07-24 VITALS — Temp 97.4°F

## 2018-07-24 DIAGNOSIS — B351 Tinea unguium: Secondary | ICD-10-CM | POA: Diagnosis not present

## 2018-07-24 DIAGNOSIS — M79675 Pain in left toe(s): Secondary | ICD-10-CM

## 2018-07-24 DIAGNOSIS — M79674 Pain in right toe(s): Secondary | ICD-10-CM | POA: Diagnosis not present

## 2018-07-24 NOTE — Progress Notes (Signed)
Complaint:  Visit Type: Patient presents  to my office for  preventative foot care services. Complaint: Patient states" my nails have grown long and thick and become painful to walk and wear shoes"  The patient presents for preventative foot care services. No changes to ROS  Podiatric Exam: Vascular: dorsalis pedis and posterior tibial pulses are palpable bilateral. Capillary return is immediate. Temperature gradient is WNL. Skin turgor WNL  Sensorium: Normal Semmes Weinstein monofilament test. Normal tactile sensation bilaterally. Nail Exam: Pt has thick disfigured discolored nails with subungual debris noted bilateral entire nail hallux through fifth toenails Ulcer Exam: There is no evidence of ulcer or pre-ulcerative changes or infection. Orthopedic Exam: Muscle tone and strength are WNL. No limitations in general ROM. No crepitus or effusions noted. Foot type and digits show no abnormalities. Bony enlargement at TNJ left foot.  Hammer toe second right. Skin: No Porokeratosis. No infection or ulcers  Diagnosis:  Onychomycosis, , Pain in right toe, pain in left toes  Treatment & Plan Procedures and Treatment: Consent by patient was obtained for treatment procedures.   Debridement of mycotic and hypertrophic toenails, 1 through 5 bilateral and clearing of subungual debris. No ulceration, no infection noted.  Return Visit-Office Procedure: Patient instructed to return to the office for a follow up visit 3 months for continued evaluation and treatment.    Gardiner Barefoot DPM

## 2018-10-23 ENCOUNTER — Ambulatory Visit: Payer: Medicare Other | Admitting: Podiatry

## 2018-10-27 ENCOUNTER — Encounter: Payer: Self-pay | Admitting: Podiatry

## 2018-10-27 ENCOUNTER — Other Ambulatory Visit: Payer: Self-pay

## 2018-10-27 ENCOUNTER — Ambulatory Visit (INDEPENDENT_AMBULATORY_CARE_PROVIDER_SITE_OTHER): Payer: Medicare Other | Admitting: Podiatry

## 2018-10-27 VITALS — Temp 98.1°F

## 2018-10-27 DIAGNOSIS — M79674 Pain in right toe(s): Secondary | ICD-10-CM

## 2018-10-27 DIAGNOSIS — M2142 Flat foot [pes planus] (acquired), left foot: Secondary | ICD-10-CM

## 2018-10-27 DIAGNOSIS — M79675 Pain in left toe(s): Secondary | ICD-10-CM | POA: Diagnosis not present

## 2018-10-27 DIAGNOSIS — B351 Tinea unguium: Secondary | ICD-10-CM | POA: Diagnosis not present

## 2018-10-27 DIAGNOSIS — M2141 Flat foot [pes planus] (acquired), right foot: Secondary | ICD-10-CM

## 2018-10-27 NOTE — Progress Notes (Signed)
Complaint:  Visit Type: Patient presents  to my office for  preventative foot care services. Complaint: Patient states" my nails have grown long and thick and become painful to walk and wear shoes"  The patient presents for preventative foot care services. No changes to ROS.  Patient says she has jammed her third toe right foot over 4 weeks ago.  She says she now moves her third toe pain free.  Podiatric Exam: Vascular: dorsalis pedis and posterior tibial pulses are palpable bilateral. Capillary return is immediate. Temperature gradient is WNL. Skin turgor WNL  Sensorium: Normal Semmes Weinstein monofilament test. Normal tactile sensation bilaterally. Nail Exam: Pt has thick disfigured discolored nails with subungual debris noted bilateral entire nail hallux through fifth toenails Ulcer Exam: There is no evidence of ulcer or pre-ulcerative changes or infection. Orthopedic Exam: Muscle tone and strength are WNL. No limitations in general ROM. No crepitus or effusions noted. Foot type and digits show no abnormalities. Bony enlargement at TNJ left foot.  Hammer toe second right. Skin: No Porokeratosis. No infection or ulcers  Diagnosis:  Onychomycosis, , Pain in right toe, pain in left toes  Treatment & Plan Procedures and Treatment: Consent by patient was obtained for treatment procedures.   Debridement of mycotic and hypertrophic toenails, 1 through 5 bilateral and clearing of subungual debris. No ulceration, no infection noted.  Her third toe has developed into a hammertoe/mallet toe.  She is having no pain or discomfort at this time. Return Visit-Office Procedure: Patient instructed to return to the office for a follow up visit 3 months for continued evaluation and treatment.    Gardiner Barefoot DPM

## 2019-01-26 ENCOUNTER — Ambulatory Visit (INDEPENDENT_AMBULATORY_CARE_PROVIDER_SITE_OTHER): Payer: Medicare Other | Admitting: Podiatry

## 2019-01-26 ENCOUNTER — Other Ambulatory Visit: Payer: Self-pay

## 2019-01-26 ENCOUNTER — Encounter: Payer: Self-pay | Admitting: Podiatry

## 2019-01-26 DIAGNOSIS — M79674 Pain in right toe(s): Secondary | ICD-10-CM

## 2019-01-26 DIAGNOSIS — B351 Tinea unguium: Secondary | ICD-10-CM | POA: Diagnosis not present

## 2019-01-26 DIAGNOSIS — M79675 Pain in left toe(s): Secondary | ICD-10-CM | POA: Diagnosis not present

## 2019-01-26 DIAGNOSIS — M2042 Other hammer toe(s) (acquired), left foot: Secondary | ICD-10-CM | POA: Insufficient documentation

## 2019-01-26 NOTE — Progress Notes (Signed)
Complaint:  Visit Type: Patient presents  to my office for  preventative foot care services. Complaint: Patient states" my nails have grown long and thick and become painful to walk and wear shoes"  The patient presents for preventative foot care services. No changes to ROS.  Patient says she has  Painful third toe tip left foot.  She requests no treatment.  Podiatric Exam: Vascular: dorsalis pedis and posterior tibial pulses are palpable bilateral. Capillary return is immediate. Temperature gradient is WNL. Skin turgor WNL  Sensorium: Normal Semmes Weinstein monofilament test. Normal tactile sensation bilaterally. Nail Exam: Pt has thick disfigured discolored nails with subungual debris noted bilateral entire nail hallux through fifth toenails Ulcer Exam: There is no evidence of ulcer or pre-ulcerative changes or infection. Orthopedic Exam: Muscle tone and strength are WNL. No limitations in general ROM. No crepitus or effusions noted. Foot type and digits show no abnormalities. Bony enlargement at TNJ left foot.  Hammer toe second right. Skin: No Porokeratosis. No infection or ulcers  Diagnosis:  Onychomycosis, , Pain in right toe, pain in left toes  Treatment & Plan Procedures and Treatment: Consent by patient was obtained for treatment procedures.   Debridement of mycotic and hypertrophic toenails, 1 through 5 bilateral and clearing of subungual debris. No ulceration, no infection noted.  Her third toe has developed into a hammertoe/mallet toe.  She is to be evaluated by Dr.  March Rummage for tendon surgery. Return Visit-Office Procedure: Patient instructed to return to the office for a follow up visit 10 weeks  for continued evaluation and treatment.    Gardiner Barefoot DPM

## 2019-02-12 ENCOUNTER — Other Ambulatory Visit: Payer: Self-pay | Admitting: Internal Medicine

## 2019-02-12 DIAGNOSIS — Z1231 Encounter for screening mammogram for malignant neoplasm of breast: Secondary | ICD-10-CM

## 2019-03-03 ENCOUNTER — Ambulatory Visit
Admission: RE | Admit: 2019-03-03 | Discharge: 2019-03-03 | Disposition: A | Discharge status: Home / Self Care | Payer: Medicare Other | Source: Ambulatory Visit | Attending: Internal Medicine | Admitting: Internal Medicine

## 2019-03-03 DIAGNOSIS — Z1231 Encounter for screening mammogram for malignant neoplasm of breast: Secondary | ICD-10-CM | POA: Diagnosis not present

## 2019-04-06 ENCOUNTER — Other Ambulatory Visit: Payer: Self-pay

## 2019-04-06 ENCOUNTER — Ambulatory Visit: Payer: Medicare Other | Admitting: Podiatry

## 2019-04-06 ENCOUNTER — Encounter: Payer: Self-pay | Admitting: Podiatry

## 2019-04-06 DIAGNOSIS — B351 Tinea unguium: Secondary | ICD-10-CM

## 2019-04-06 DIAGNOSIS — M79674 Pain in right toe(s): Secondary | ICD-10-CM

## 2019-04-06 DIAGNOSIS — M79675 Pain in left toe(s): Secondary | ICD-10-CM | POA: Diagnosis not present

## 2019-04-06 NOTE — Progress Notes (Signed)
Complaint:  Visit Type: Patient presents  to my office for  preventative foot care services. Complaint: Patient states" my nails have grown long and thick and become painful to walk and wear shoes"  The patient presents for preventative foot care services. No changes to ROS.  Patient says she has  Painful third toe tip left foot.  She requests no treatment.  Podiatric Exam: Vascular: dorsalis pedis and posterior tibial pulses are palpable bilateral. Capillary return is immediate. Temperature gradient is WNL. Skin turgor WNL  Sensorium: Normal Semmes Weinstein monofilament test. Normal tactile sensation bilaterally. Nail Exam: Pt has thick disfigured discolored nails with subungual debris noted bilateral entire nail hallux through fifth toenails Ulcer Exam: There is no evidence of ulcer or pre-ulcerative changes or infection. Orthopedic Exam: Muscle tone and strength are WNL. No limitations in general ROM. No crepitus or effusions noted. Foot type and digits show no abnormalities. Bony enlargement at TNJ left foot.  Hammer toe second right. Skin: No Porokeratosis. No infection or ulcers  Diagnosis:  Onychomycosis, , Pain in right toe, pain in left toes  Treatment & Plan Procedures and Treatment: Consent by patient was obtained for treatment procedures.   Debridement of mycotic and hypertrophic toenails, 1 through 5 bilateral and clearing of subungual debris. No ulceration, no infection noted.   Return Visit-Office Procedure: Patient instructed to return to the office for a follow up visit 10 weeks  for continued evaluation and treatment.    Gardiner Barefoot DPM

## 2019-05-22 DIAGNOSIS — H04123 Dry eye syndrome of bilateral lacrimal glands: Secondary | ICD-10-CM | POA: Diagnosis not present

## 2019-05-22 DIAGNOSIS — Z961 Presence of intraocular lens: Secondary | ICD-10-CM | POA: Diagnosis not present

## 2019-05-22 DIAGNOSIS — H02883 Meibomian gland dysfunction of right eye, unspecified eyelid: Secondary | ICD-10-CM | POA: Diagnosis not present

## 2019-05-22 DIAGNOSIS — H02886 Meibomian gland dysfunction of left eye, unspecified eyelid: Secondary | ICD-10-CM | POA: Diagnosis not present

## 2019-06-15 ENCOUNTER — Ambulatory Visit: Payer: PPO | Admitting: Podiatry

## 2019-10-09 DIAGNOSIS — E782 Mixed hyperlipidemia: Secondary | ICD-10-CM | POA: Diagnosis not present

## 2019-10-09 DIAGNOSIS — E538 Deficiency of other specified B group vitamins: Secondary | ICD-10-CM | POA: Diagnosis not present

## 2019-10-16 DIAGNOSIS — K5100 Ulcerative (chronic) pancolitis without complications: Secondary | ICD-10-CM | POA: Diagnosis not present

## 2019-10-16 DIAGNOSIS — E782 Mixed hyperlipidemia: Secondary | ICD-10-CM | POA: Diagnosis not present

## 2019-10-16 DIAGNOSIS — G4733 Obstructive sleep apnea (adult) (pediatric): Secondary | ICD-10-CM | POA: Diagnosis not present

## 2019-10-16 DIAGNOSIS — Z9989 Dependence on other enabling machines and devices: Secondary | ICD-10-CM | POA: Diagnosis not present

## 2019-10-16 DIAGNOSIS — E538 Deficiency of other specified B group vitamins: Secondary | ICD-10-CM | POA: Diagnosis not present

## 2019-10-16 DIAGNOSIS — Z0000 Encounter for general adult medical examination without abnormal findings: Secondary | ICD-10-CM | POA: Diagnosis not present

## 2019-10-16 DIAGNOSIS — E559 Vitamin D deficiency, unspecified: Secondary | ICD-10-CM | POA: Diagnosis not present

## 2019-12-17 DIAGNOSIS — L821 Other seborrheic keratosis: Secondary | ICD-10-CM | POA: Diagnosis not present

## 2019-12-17 DIAGNOSIS — D2261 Melanocytic nevi of right upper limb, including shoulder: Secondary | ICD-10-CM | POA: Diagnosis not present

## 2019-12-17 DIAGNOSIS — D2262 Melanocytic nevi of left upper limb, including shoulder: Secondary | ICD-10-CM | POA: Diagnosis not present

## 2019-12-17 DIAGNOSIS — D485 Neoplasm of uncertain behavior of skin: Secondary | ICD-10-CM | POA: Diagnosis not present

## 2019-12-17 DIAGNOSIS — D225 Melanocytic nevi of trunk: Secondary | ICD-10-CM | POA: Diagnosis not present

## 2019-12-17 DIAGNOSIS — L570 Actinic keratosis: Secondary | ICD-10-CM | POA: Diagnosis not present

## 2020-03-30 ENCOUNTER — Ambulatory Visit
Admission: RE | Admit: 2020-03-30 | Discharge: 2020-03-30 | Disposition: A | Discharge status: Home / Self Care | Payer: PPO | Source: Ambulatory Visit | Attending: Family Medicine | Admitting: Family Medicine

## 2020-03-30 ENCOUNTER — Ambulatory Visit (INDEPENDENT_AMBULATORY_CARE_PROVIDER_SITE_OTHER): Payer: PPO

## 2020-03-30 ENCOUNTER — Other Ambulatory Visit: Payer: Self-pay

## 2020-03-30 VITALS — BP 170/73 | HR 71 | Temp 98.3°F | Resp 18

## 2020-03-30 DIAGNOSIS — M79642 Pain in left hand: Secondary | ICD-10-CM | POA: Diagnosis not present

## 2020-03-30 DIAGNOSIS — W231XXA Caught, crushed, jammed, or pinched between stationary objects, initial encounter: Secondary | ICD-10-CM

## 2020-03-30 DIAGNOSIS — S6992XA Unspecified injury of left wrist, hand and finger(s), initial encounter: Secondary | ICD-10-CM | POA: Diagnosis not present

## 2020-03-30 DIAGNOSIS — M19042 Primary osteoarthritis, left hand: Secondary | ICD-10-CM | POA: Diagnosis not present

## 2020-03-30 NOTE — Discharge Instructions (Addendum)
Keep the hand clean and covered.  Wash with normal soap and water daily and change dressing.  Antibiotic ointment over the next couple days to prevent infection. Then let it air out daily to prevent delayed healing.  If the Steri-Strips fall off and the wound is still open you can apply new Steri-Strips. Please watch for signs of infection to include severe swelling, redness at the site or drainage. Follow up as needed for continued or worsening symptoms

## 2020-03-30 NOTE — ED Notes (Signed)
Triaged by provider  

## 2020-03-31 NOTE — ED Provider Notes (Signed)
Roderic Palau    CSN: [id number] Arrival date & time: 03/30/20  1055      History   Chief Complaint No chief complaint on file.   HPI Patricia Haynes is a 75 y.o. female.   Patient is a 75 year old female who presents today for left hand injury.  This occurred yesterday.  Reporting that Patricia Haynes got the hand caught between the car in the garage.  Multiple skin tears to hand.  Mild swelling.  Normal range of motion.  Cleaned area and placed bandages.       Past Medical History:  Diagnosis Date  . Allergic state   . B12 deficiency   . Cancer ([place])    skin  . GERD (gastroesophageal reflux disease)   . Hyperlipidemia   . Hypertension   . Reflux esophagitis   . Sleep apnea    developed cough when using cpap and not using  . Ulcerative colitis [facility])     Patient Active Problem List   Diagnosis Date Noted  . Hammer toe of second toe of left foot 01/26/2019  . Tubular adenoma 02/14/2017  . Benign essential hypertension 12/24/2016  . OSA on CPAP 11/09/2016  . Medicare annual wellness visit, initial 09/07/2016  . Primary osteoarthritis of left knee 09/04/2016  . Hyperlipidemia, mixed 08/16/2016  . B12 deficiency 02/11/2014  . Gastro-esophageal reflux disease with esophagitis 07/31/2013  . Ulcerative colitis ([place]) 07/31/2013    Past Surgical History:  Procedure Laterality Date  . COLONOSCOPY    . COLONOSCOPY WITH PROPOFOL N/A 01/18/2017   Procedure: COLONOSCOPY WITH PROPOFOL;  Surgeon: Manya Silvas, MD;  Location: [facility] ENDOSCOPY;  Service: Endoscopy;  Laterality: N/A;  . ESOPHAGOGASTRODUODENOSCOPY    . NASAL SINUS SURGERY    . POLYPECTOMY    . SIGMOIDOSCOPY      OB History   No obstetric history on file.      Home Medications    Prior to Admission medications   Medication Sig Start Date End Date Taking? Authorizing Provider  ALPRAZolam Duanne Moron) 0.5 MG tablet Take 0.5 mg at bedtime as needed by mouth for anxiety.    [provider]  amLODipine  (NORVASC) 5 MG tablet Take 5 mg daily by mouth.    [provider]  aspirin EC 81 MG tablet Take 81 mg daily by mouth.    [provider]  azelastine (ASTELIN) 0.1 % nasal spray Place 2 (two) times daily into both nostrils. Use in each nostril as directed    [provider]  Cholecalciferol 2000 units TABS Take 2,000 Units by mouth.    [provider]  Cyanocobalamin (VITAMIN B-12) 2500 MCG SUBL Place under the tongue.    [provider]  doxazosin (CARDURA) 4 MG tablet  10/28/17   [provider]  loratadine (CLARITIN) 10 MG tablet Take 10 mg daily by mouth.    [provider]  meloxicam (MOBIC) 7.5 MG tablet  10/08/17   [provider]  ofloxacin (OCUFLOX) 0.3 % ophthalmic solution  10/23/18   [provider]  Omega-3 Fatty Acids (FISH OIL PO) Take by mouth.    [provider]  omeprazole (PRILOSEC) 40 MG capsule Take 40 mg daily by mouth.    [provider]  penicillin v potassium (VEETID) 500 MG tablet  09/30/18   [provider]  pravastatin (PRAVACHOL) 20 MG tablet Take 20 mg daily by mouth.    [provider]  prednisoLONE acetate (PRED FORTE) 1 % ophthalmic  suspension  10/23/18   [provider]  rosuvastatin (CRESTOR) 5 MG tablet  10/10/18   [provider]  spironolactone (ALDACTONE) 50 MG tablet  10/08/17   [provider]    Family History Family History  Problem Relation Age of Onset  . Breast cancer Neg Hx     Social History Social History   Tobacco Use  . Smoking status: Former Research scientist (life sciences)  . Smokeless tobacco: Never Used  Substance Use Topics  . Alcohol use: No  . Drug use: No     Allergies   Codeine, Etodolac, and Latex   Review of Systems Review of Systems   Physical Exam Triage Vital Signs ED Triage Vitals [03/30/20 1105]  Enc Vitals Group     BP (!) 170/73     Pulse Rate 71     Resp 18     Temp 98.3 F (36.8 C)      Temp Source Oral     SpO2 95 %     Weight      Height      Head Circumference      Peak Flow      Pain Score      Pain Loc      Pain Edu?      Excl. in [place]?    No data found.  Updated Vital Signs BP (!) 170/73 (BP Location: Left Arm)   Pulse 71   Temp 98.3 F (36.8 C) (Oral)   Resp 18   SpO2 95%   Visual Acuity Right Eye Distance:   Left Eye Distance:   Bilateral Distance:    Right Eye Near:   Left Eye Near:    Bilateral Near:     Physical Exam Vitals and nursing note reviewed.  Constitutional:      General: Patricia Haynes is not in acute distress.    Appearance: Normal appearance. Patricia Haynes is not ill-appearing, toxic-appearing or diaphoretic.  HENT:     Head: Normocephalic.  Eyes:     Conjunctiva/sclera: Conjunctivae normal.  Pulmonary:     Effort: Pulmonary effort is normal.  Musculoskeletal:        General: Normal range of motion.     Cervical back: Normal range of motion.     Comments: Skin tear to the left middle finger and dorsal hand. Bleeding controlled.  Mild swelling to the left middle finger.  Limited flexion.   Skin:    General: Skin is warm and dry.     Findings: No rash.  Neurological:     Mental Status: Patricia Haynes is alert.  Psychiatric:        Mood and Affect: Mood normal.      UC Treatments / Results  Labs (all labs ordered are listed, but only abnormal results are displayed) Labs Reviewed - No data to display  EKG   Radiology DG Hand Complete Left  Result Date: 03/30/2020 CLINICAL DATA:  Hand injury. EXAM: LEFT HAND - COMPLETE 3+ VIEW COMPARISON:  No prior. FINDINGS: Soft tissue air noted over the left fourth digit. No evidence of acute fracture or dislocation. Diffuse degenerative change. Degenerative changes most prominent the first carpometacarpal joint. Tiny corticated bony density noted adjacent to the first carpometacarpal joint may be related to degenerative change and or old injury. No radiopaque foreign body. IMPRESSION: 1. Soft tissue air noted  over the left fourth digit. No radiopaque foreign body. No acute bony abnormality. 2. Diffuse degenerative change. Degenerative changes most prominent the first carpometacarpal joint. Electronically Signed  ByMarcello Moores  Register   On: 03/30/2020 11:27    Procedures Procedures (including critical care time)  Medications Ordered in UC Medications - No data to display  Initial Impression / Assessment and Plan / UC Course  I have reviewed the triage vital signs and the nursing notes.  Pertinent labs & imaging results that were available during my care of the patient were reviewed by me and considered in my medical decision making (see chart for details).     Left hand injury X Latif with no acute findings Wounds cleaned here in the clinic and placed abx ointment and dressed.  2 steri strips placed on skin tear to the dorsal hand.  Instruction given on how to take care of the wound.  Infection precautions given.  Follow up as needed for continued or worsening symptoms  Final Clinical Impressions(s) / UC Diagnoses   Final diagnoses:  Hand injury, left, initial encounter     Discharge Instructions     Keep the hand clean and covered.  Wash with normal soap and water daily and change dressing.  Antibiotic ointment over the next couple days to prevent infection. Then let it air out daily to prevent delayed healing.  If the Steri-Strips fall off and the wound is still open you can apply new Steri-Strips. Please watch for signs of infection to include severe swelling, redness at the site or drainage. Follow up as needed for continued or worsening symptoms     ED Prescriptions    None     PDMP not reviewed this encounter.   Orvan July, NP 03/31/20 [phone]

## 2020-09-01 IMAGING — MG DIGITAL SCREENING BILAT W/ TOMO W/ CAD
8 series · 8 of 24 positions shown · non-contrast
Comparison: None.

CLINICAL DATA: Screening.

EXAM:
DIGITAL SCREENING BILATERAL MAMMOGRAM WITH TOMO AND CAD

[R MLO synth-2D]
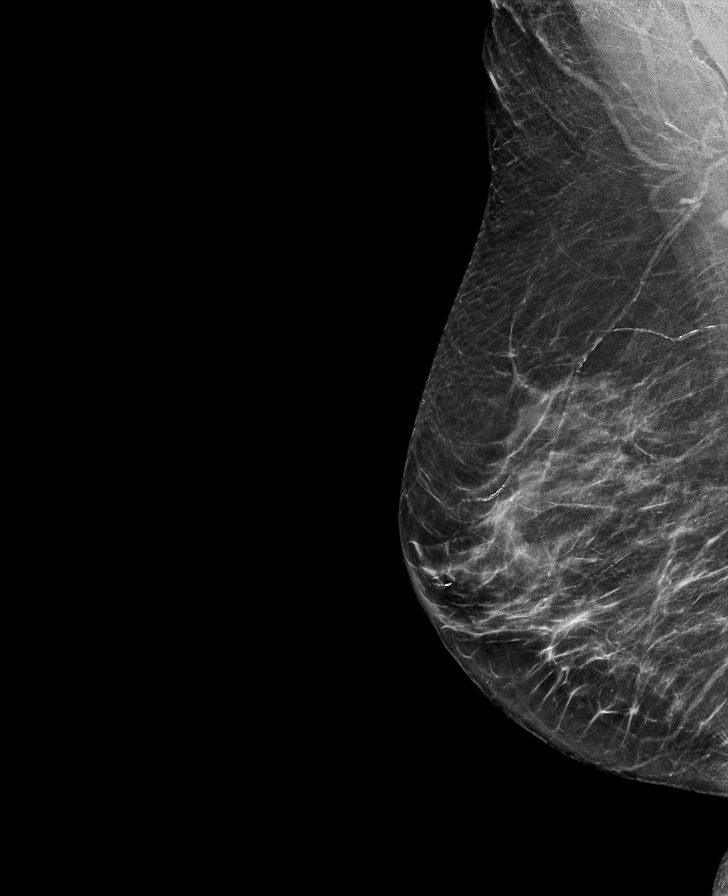

[R CC synth-2D]
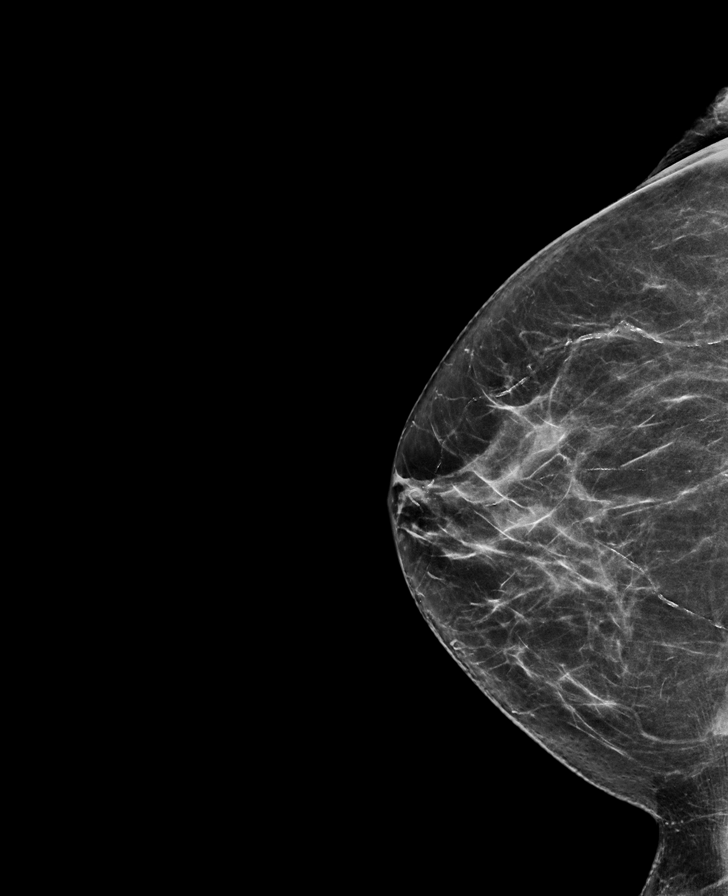

[L CC synth-2D]
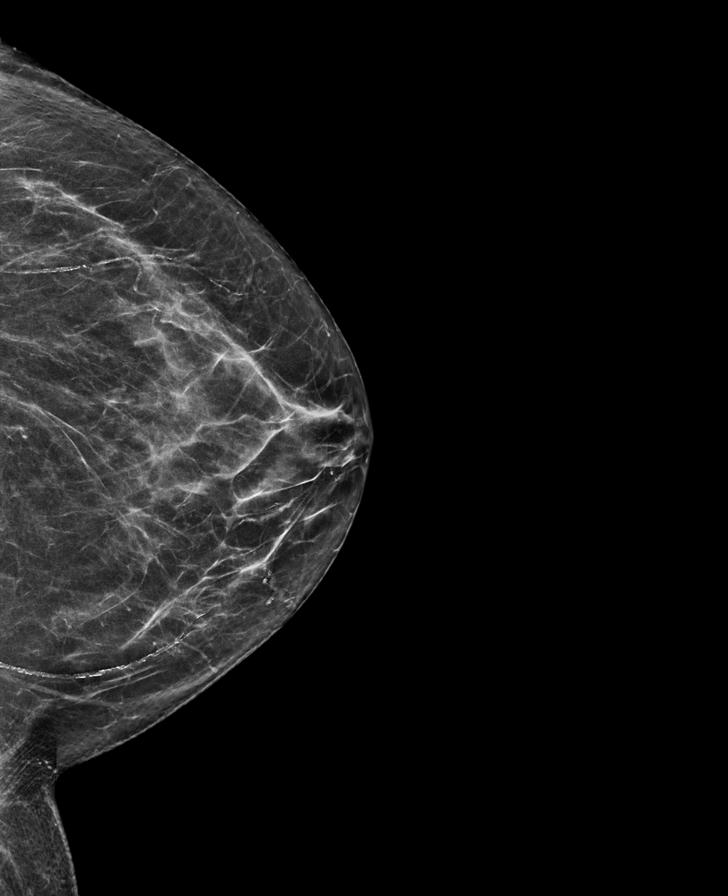

[L MLO synth-2D]
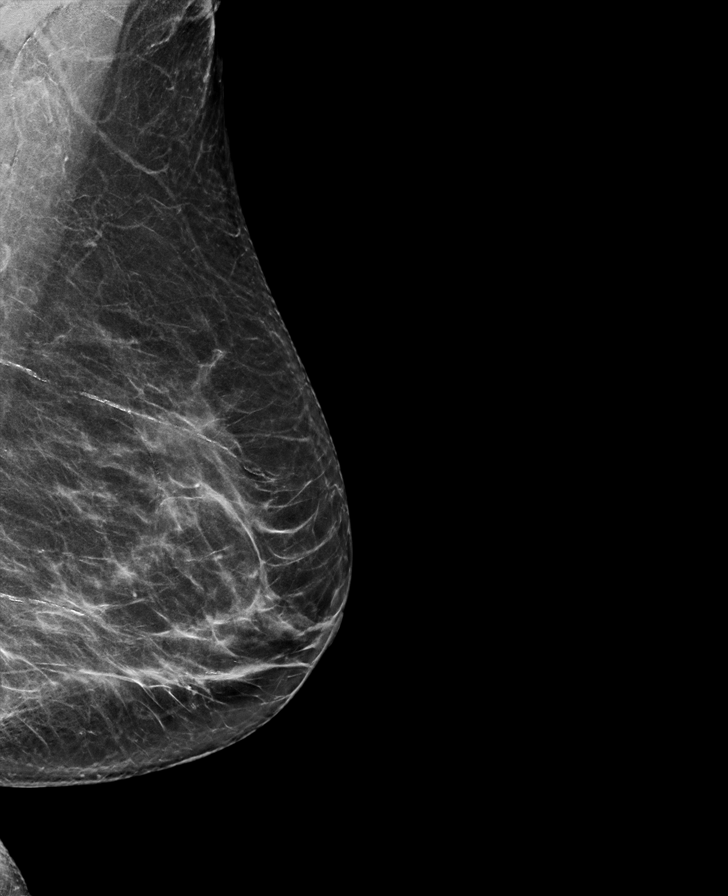

[R MLO tomo · tomo slice 33/64.0]
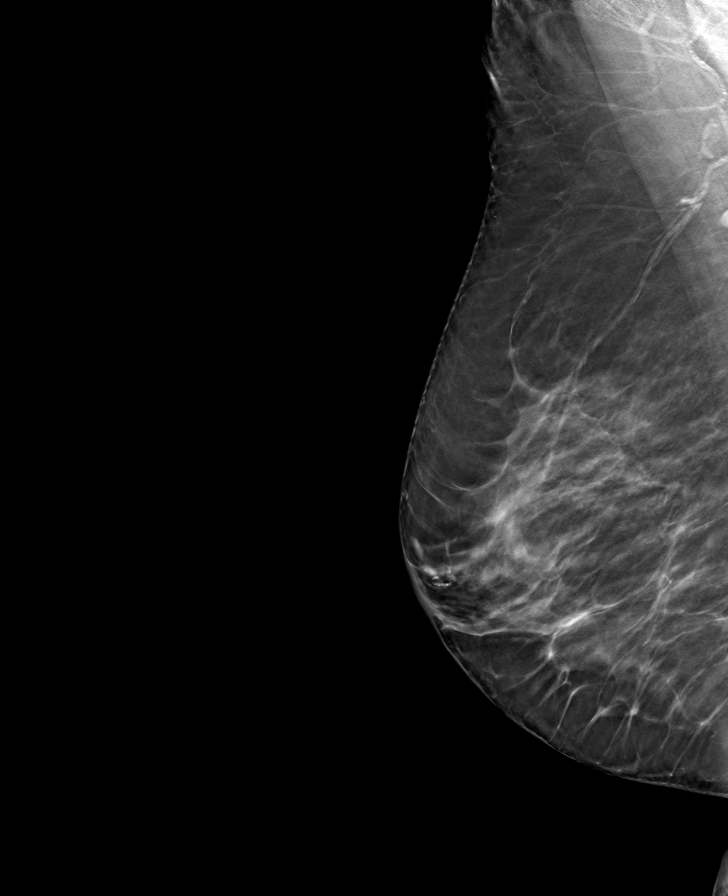

[R CC tomo · tomo slice 34/67.0]
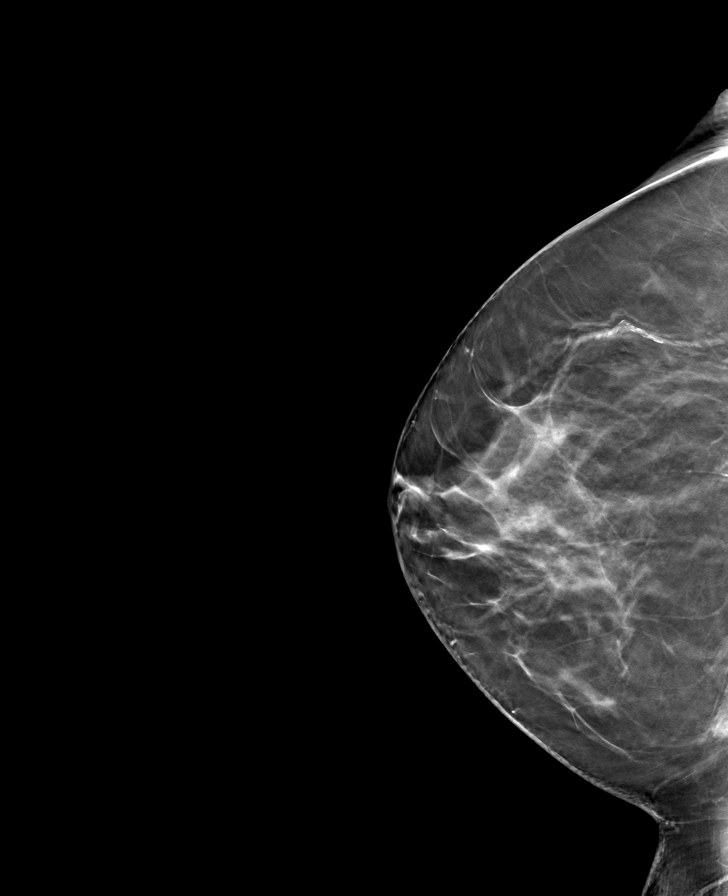

[L MLO tomo · tomo slice 35/69.0]
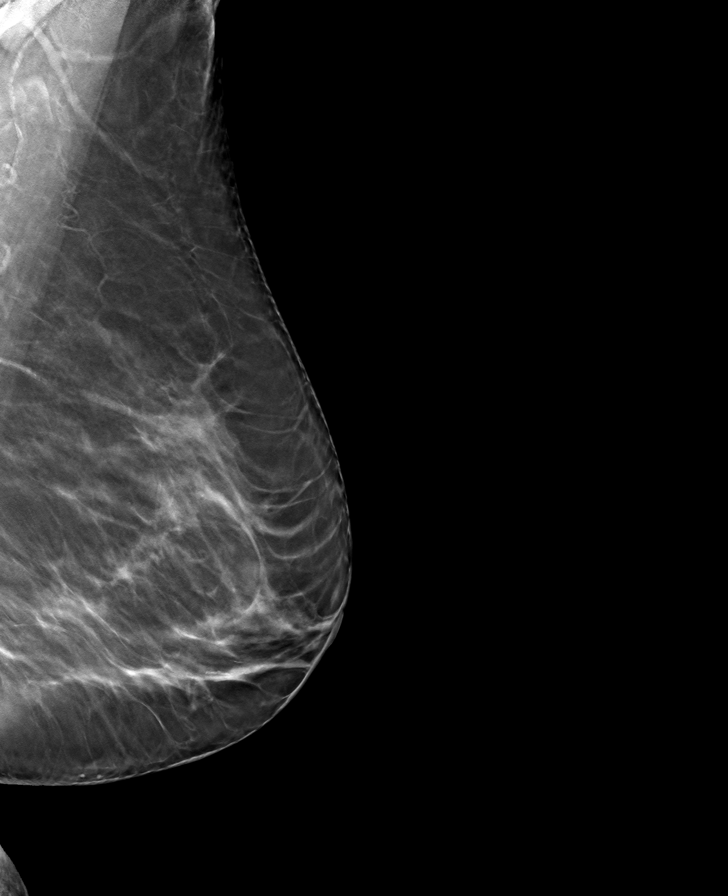

[L CC tomo · tomo slice 31/61.0]
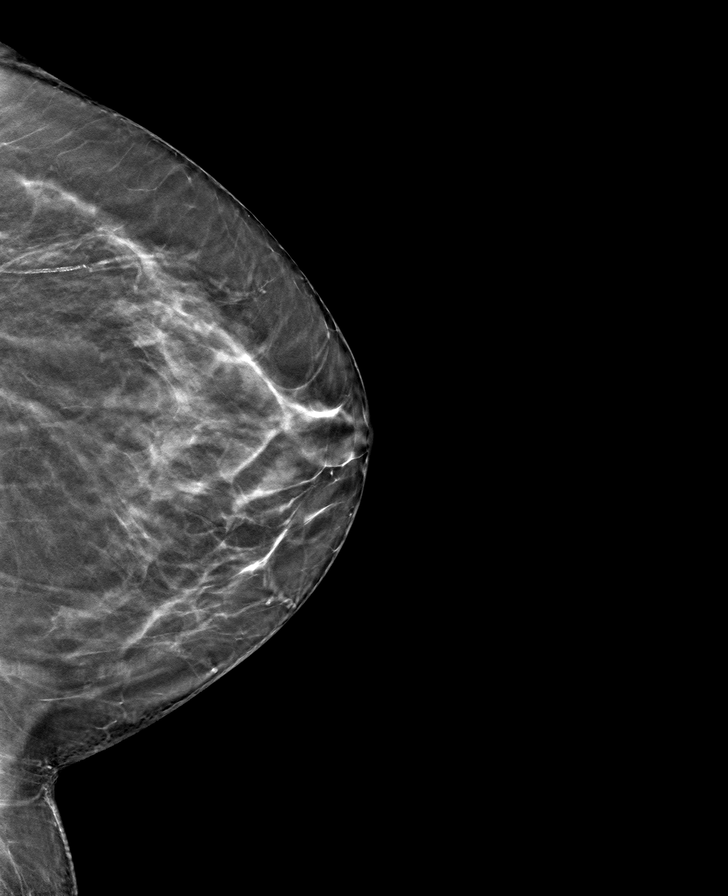

[8 of 24 positions shown; findings below may reference images not displayed]

ACR Breast Density Category b: There are scattered areas of
fibroglandular density.
FINDINGS: There are no findings suspicious for malignancy. Images were
processed with CAD.
IMPRESSION: No mammographic evidence of malignancy. A result letter of this
screening mammogram will be mailed directly to the patient.

RECOMMENDATION:
Screening mammogram in one year. (Code:Y5-G-EJ6)

BI-RADS CATEGORY  1: Negative.

## 2020-09-26 DIAGNOSIS — Z961 Presence of intraocular lens: Secondary | ICD-10-CM | POA: Diagnosis not present

## 2020-09-26 DIAGNOSIS — H524 Presbyopia: Secondary | ICD-10-CM | POA: Diagnosis not present

## 2020-09-26 DIAGNOSIS — H43393 Other vitreous opacities, bilateral: Secondary | ICD-10-CM | POA: Diagnosis not present

## 2020-09-26 DIAGNOSIS — H02886 Meibomian gland dysfunction of left eye, unspecified eyelid: Secondary | ICD-10-CM | POA: Diagnosis not present

## 2020-09-26 DIAGNOSIS — H16223 Keratoconjunctivitis sicca, not specified as Sjogren's, bilateral: Secondary | ICD-10-CM | POA: Diagnosis not present

## 2020-09-26 DIAGNOSIS — H02883 Meibomian gland dysfunction of right eye, unspecified eyelid: Secondary | ICD-10-CM | POA: Diagnosis not present

## 2020-10-28 DIAGNOSIS — E538 Deficiency of other specified B group vitamins: Secondary | ICD-10-CM | POA: Diagnosis not present

## 2020-10-28 DIAGNOSIS — E559 Vitamin D deficiency, unspecified: Secondary | ICD-10-CM | POA: Diagnosis not present

## 2020-10-28 DIAGNOSIS — E782 Mixed hyperlipidemia: Secondary | ICD-10-CM | POA: Diagnosis not present

## 2020-11-04 DIAGNOSIS — Z0000 Encounter for general adult medical examination without abnormal findings: Secondary | ICD-10-CM | POA: Diagnosis not present

## 2020-11-04 DIAGNOSIS — K5100 Ulcerative (chronic) pancolitis without complications: Secondary | ICD-10-CM | POA: Diagnosis not present

## 2020-11-04 DIAGNOSIS — E538 Deficiency of other specified B group vitamins: Secondary | ICD-10-CM | POA: Diagnosis not present

## 2020-11-04 DIAGNOSIS — G4733 Obstructive sleep apnea (adult) (pediatric): Secondary | ICD-10-CM | POA: Diagnosis not present

## 2020-11-04 DIAGNOSIS — E782 Mixed hyperlipidemia: Secondary | ICD-10-CM | POA: Diagnosis not present

## 2020-11-04 DIAGNOSIS — Z9989 Dependence on other enabling machines and devices: Secondary | ICD-10-CM | POA: Diagnosis not present

## 2021-01-16 ENCOUNTER — Ambulatory Visit: Payer: PPO | Admitting: Podiatry

## 2021-02-07 DIAGNOSIS — M1990 Unspecified osteoarthritis, unspecified site: Secondary | ICD-10-CM | POA: Diagnosis not present

## 2021-02-07 DIAGNOSIS — I10 Essential (primary) hypertension: Secondary | ICD-10-CM | POA: Diagnosis not present

## 2021-02-07 DIAGNOSIS — R5383 Other fatigue: Secondary | ICD-10-CM | POA: Diagnosis not present

## 2021-02-27 ENCOUNTER — Other Ambulatory Visit: Payer: Self-pay

## 2021-02-27 ENCOUNTER — Encounter: Payer: Self-pay | Admitting: Podiatry

## 2021-02-27 ENCOUNTER — Ambulatory Visit: Payer: PPO | Admitting: Podiatry

## 2021-02-27 DIAGNOSIS — M79675 Pain in left toe(s): Secondary | ICD-10-CM

## 2021-02-27 DIAGNOSIS — B351 Tinea unguium: Secondary | ICD-10-CM | POA: Diagnosis not present

## 2021-02-27 DIAGNOSIS — M79674 Pain in right toe(s): Secondary | ICD-10-CM | POA: Diagnosis not present

## 2021-02-27 NOTE — Progress Notes (Signed)
This patient returns to the office for evaluation and treatment of long thick painful nails .  This patient is unable to trim her own nails since the patient cannot reach herfeet.  Patient says the nails are painful walking and wearing his shoes.  She returns for preventive foot care services.  General Appearance  Alert, conversant and in no acute stress.  Vascular  Dorsalis pedis and posterior tibial  pulses are palpable  bilaterally.  Capillary return is within normal limits  bilaterally. Temperature is within normal limits  bilaterally.  Neurologic  Senn-Weinstein monofilament wire test within normal limits  bilaterally. Muscle power within normal limits bilaterally.  Nails Thick disfigured discolored nails with subungual debris  from hallux to fifth toes bilaterally. No evidence of bacterial infection or drainage bilaterally.  Orthopedic  No limitations of motion  feet .  No crepitus or effusions noted.  Hammer toes  B/L.  Skin  normotropic skin with no porokeratosis noted bilaterally.  No signs of infections or ulcers noted.     Onychomycosis  Pain in toes right foot  Pain in toes left foot  Debridement  of nails  1-5  B/L with a nail nipper.  Nails were then filed using a dremel tool with no incidents.    RTC 3 months   Gardiner Barefoot DPM

## 2021-06-05 ENCOUNTER — Other Ambulatory Visit: Payer: Self-pay

## 2021-06-05 ENCOUNTER — Encounter: Payer: Self-pay | Admitting: Podiatry

## 2021-06-05 ENCOUNTER — Ambulatory Visit: Payer: PPO | Admitting: Podiatry

## 2021-06-05 DIAGNOSIS — B351 Tinea unguium: Secondary | ICD-10-CM

## 2021-06-05 DIAGNOSIS — M79674 Pain in right toe(s): Secondary | ICD-10-CM | POA: Diagnosis not present

## 2021-06-05 DIAGNOSIS — M79675 Pain in left toe(s): Secondary | ICD-10-CM | POA: Diagnosis not present

## 2021-06-05 NOTE — Progress Notes (Signed)
This patient returns to the office for evaluation and treatment of long thick painful nails .  This patient is unable to trim her own nails since the patient cannot reach herfeet.  Patient says the nails are painful walking and wearing his shoes.  She returns for preventive foot care services. ? ?General Appearance  Alert, conversant and in no acute stress. ? ?Vascular  Dorsalis pedis and posterior tibial  pulses are palpable  bilaterally.  Capillary return is within normal limits  bilaterally. Temperature is within normal limits  bilaterally. ? ?Neurologic  Senn-Weinstein monofilament wire test within normal limits  bilaterally. Muscle power within normal limits bilaterally. ? ?Nails Thick disfigured discolored nails with subungual debris  from hallux to fifth toes bilaterally. No evidence of bacterial infection or drainage bilaterally. ? ?Orthopedic  No limitations of motion  feet .  No crepitus or effusions noted.  Hammer toes  B/L. ? ?Skin  normotropic skin with no porokeratosis noted bilaterally.  No signs of infections or ulcers noted.    ? ?Onychomycosis  Pain in toes right foot  Pain in toes left foot ? ?Debridement  of nails  1-5  B/L with a nail nipper.  Nails were then filed using a dremel tool with no incidents.    RTC 3 months ? ? ?Gardiner Barefoot DPM   ?

## 2021-09-07 ENCOUNTER — Ambulatory Visit: Payer: PPO | Admitting: Podiatry

## 2023-01-02 ENCOUNTER — Ambulatory Visit: Payer: PPO | Admitting: Anesthesiology

## 2023-01-02 ENCOUNTER — Encounter
Admission: RE | Disposition: A | Discharge status: Home / Self Care | Payer: Self-pay | Source: Home / Self Care | Attending: Internal Medicine

## 2023-01-02 ENCOUNTER — Ambulatory Visit
Admission: RE | Admit: 2023-01-02 | Discharge: 2023-01-02 | Disposition: A | Discharge status: Home / Self Care | Payer: PPO | Attending: Internal Medicine | Admitting: Internal Medicine

## 2023-01-02 ENCOUNTER — Encounter: Payer: Self-pay | Admitting: Internal Medicine

## 2023-01-02 DIAGNOSIS — Z860101 Personal history of adenomatous and serrated colon polyps: Secondary | ICD-10-CM | POA: Insufficient documentation

## 2023-01-02 DIAGNOSIS — E785 Hyperlipidemia, unspecified: Secondary | ICD-10-CM | POA: Insufficient documentation

## 2023-01-02 DIAGNOSIS — Z79899 Other long term (current) drug therapy: Secondary | ICD-10-CM | POA: Insufficient documentation

## 2023-01-02 DIAGNOSIS — K5130 Ulcerative (chronic) rectosigmoiditis without complications: Secondary | ICD-10-CM | POA: Diagnosis present

## 2023-01-02 DIAGNOSIS — G4730 Sleep apnea, unspecified: Secondary | ICD-10-CM | POA: Diagnosis not present

## 2023-01-02 DIAGNOSIS — Z09 Encounter for follow-up examination after completed treatment for conditions other than malignant neoplasm: Secondary | ICD-10-CM | POA: Diagnosis not present

## 2023-01-02 DIAGNOSIS — I10 Essential (primary) hypertension: Secondary | ICD-10-CM | POA: Insufficient documentation

## 2023-01-02 DIAGNOSIS — K219 Gastro-esophageal reflux disease without esophagitis: Secondary | ICD-10-CM | POA: Diagnosis not present

## 2023-01-02 HISTORY — PX: BIOPSY: SHX5522

## 2023-01-02 HISTORY — PX: COLONOSCOPY WITH PROPOFOL: SHX5780

## 2023-01-02 SURGERY — COLONOSCOPY WITH PROPOFOL
Anesthesia: General

## 2023-01-02 MED ORDER — STERILE WATER FOR IRRIGATION IR SOLN
Status: DC | PRN
  Administered 2023-01-02: 100 mL

## 2023-01-02 MED ORDER — PROPOFOL 500 MG/50ML IV EMUL
INTRAVENOUS | Status: DC | PRN
  Administered 2023-01-02: 150 ug/kg/min via INTRAVENOUS

## 2023-01-02 MED ORDER — PROPOFOL 1000 MG/100ML IV EMUL
INTRAVENOUS | Status: AC
Start: 2023-01-02 — End: ?
  Filled 2023-01-02: qty 100

## 2023-01-02 MED ORDER — SODIUM CHLORIDE 0.9 % IV SOLN: INTRAVENOUS | Status: DC

## 2023-01-02 MED ORDER — PROPOFOL 10 MG/ML IV BOLUS
INTRAVENOUS | Status: DC | PRN
  Administered 2023-01-02 (×2): 50 mg via INTRAVENOUS

## 2023-01-02 NOTE — H&P (Signed)
Outpatient short stay form Pre-procedure 01/02/2023 9:27 AM Patricia Haynes K. [name], M.D.  Primary Physician: Bethann Punches  Reason for visit:  Ulcerative recto-sigmoiditis, tubular adenoma hx,   History of present illness:  Patricia Haynes is a very pleasant 77 year old female presenting for request for colonoscopy given history of colon polyps as well as ulcerative colitis. 01/18/2017 - Two polyps removed - RTE - TA x 1 and Inflammatory polyp x 1. Hx of idiopathic inflammatory bowel disease on colonoscopies in 2009 and 2012. Distribution - left sided. Patient denies any persistent diarrhea or rectal bleeding or tenesmus. She has some loose stools following a constipated stool. She takes MiraLAX haphazardly to help soften the stools. Patient has history of mild GERD which is controlled with omeprazole 40 mg. She also takes his medications sporadically. She has mild globus sensation in the throat particularly with postural changes such as extending the neck or tucking the chin. No hematemesis, anorexia or involuntary weight loss.    No current facility-administered medications for this encounter.  Medications Prior to Admission  Medication Sig Dispense Refill Last Dose   ALPRAZolam (XANAX) 0.5 MG tablet Take 0.5 mg at bedtime as needed by mouth for anxiety.   01/01/2023   amLODipine (NORVASC) 5 MG tablet Take 5 mg daily by mouth.   01/02/2023   aspirin EC 81 MG tablet Take 81 mg daily by mouth.   01/01/2023   Cholecalciferol 2000 units TABS Take 2,000 Units by mouth.   Past Week   Cyanocobalamin (VITAMIN B-12) 2500 MCG SUBL Place under the tongue.   Past Week   doxazosin (CARDURA) 4 MG tablet   2 Past Week   loratadine (CLARITIN) 10 MG tablet Take 10 mg daily by mouth.   Past Week   Omega-3 Fatty Acids (FISH OIL PO) Take by mouth.   Past Week   rosuvastatin (CRESTOR) 5 MG tablet    01/01/2023   spironolactone (ALDACTONE) 50 MG tablet   2 01/01/2023   azelastine (ASTELIN) 0.1 % nasal spray Place 2 (two)  times daily into both nostrils. Use in each nostril as directed      meloxicam (MOBIC) 7.5 MG tablet  (Patient not taking: Reported on 01/02/2023)  2 Not Taking   ofloxacin (OCUFLOX) 0.3 % ophthalmic solution       omeprazole (PRILOSEC) 40 MG capsule Take 40 mg daily by mouth.      penicillin v potassium (VEETID) 500 MG tablet       pravastatin (PRAVACHOL) 20 MG tablet Take 20 mg daily by mouth. (Patient not taking: Reported on 01/02/2023)   Not Taking   prednisoLONE acetate (PRED FORTE) 1 % ophthalmic suspension         Allergies  Allergen Reactions   Codeine    Etodolac Other (See Comments)    Heart races   Latex      Past Medical History:  Diagnosis Date   Allergic state    B12 deficiency    Cancer (HCC)    skin   GERD (gastroesophageal reflux disease)    Hyperlipidemia    Hypertension    Reflux esophagitis    Sleep apnea    developed cough when using cpap and not using   Ulcerative colitis (HCC)     Review of systems:  Otherwise negative.    Physical Exam  Gen: Alert, oriented. Appears stated age.  HEENT: [place]/AT. PERRLA. Lungs: CTA, no wheezes. CV: RR nl S1, S2. Abd: soft, benign, no masses. BS+ Ext: No edema. Pulses 2+  Planned procedures: Proceed with colonoscopy. The patient understands the nature of the planned procedure, indications, risks, alternatives and potential complications including but not limited to bleeding, infection, perforation, damage to internal organs and possible oversedation/side effects from anesthesia. The patient agrees and gives consent to proceed.  Please refer to procedure notes for findings, recommendations and patient disposition/instructions.     Drystan Reader K. [name], M.D. Gastroenterology 01/02/2023  9:27 AM

## 2023-01-02 NOTE — Anesthesia Postprocedure Evaluation (Signed)
Anesthesia Post Note  Patient: Patricia Haynes  Procedure(s) Performed: COLONOSCOPY WITH PROPOFOL BIOPSY  Patient location during evaluation: PACU Anesthesia Type: General Level of consciousness: awake and awake and alert Pain management: satisfactory to patient Vital Signs Assessment: post-procedure vital signs reviewed and stable Respiratory status: nonlabored ventilation Cardiovascular status: blood pressure returned to baseline Anesthetic complications: no   There were no known notable events for this encounter.   Last Vitals:  Vitals:   01/02/23 1013 01/02/23 1023  BP: 131/61 (!) 118/98  Pulse:  (!) 53  Resp: (!) 21 16  Temp:    SpO2: 98% 99%    Last Pain:  Vitals:   01/02/23 1023  TempSrc:   PainSc: 0-No pain                 VAN STAVEREN,Jaydn Moscato

## 2023-01-02 NOTE — Transfer of Care (Signed)
Immediate Anesthesia Transfer of Care Note  Patient: Patricia Haynes  Procedure(s) Performed: COLONOSCOPY WITH PROPOFOL BIOPSY  Patient Location: PACU  Anesthesia Type:General  Level of Consciousness: awake and sedated  Airway & Oxygen Therapy: Patient Spontanous Breathing and Patient connected to nasal cannula oxygen  Post-op Assessment: Report given to RN and Post -op Vital signs reviewed and stable  Post vital signs: Reviewed and stable  Last Vitals:  Vitals Value Taken Time  BP 118/59 01/02/23 1003  Temp    Pulse 61 01/02/23 1004  Resp 20 01/02/23 1004  SpO2 97 % 01/02/23 1004  Vitals shown include unfiled device data.  Last Pain:  Vitals:   01/02/23 1003  TempSrc:   PainSc: Asleep         Complications: There were no known notable events for this encounter.

## 2023-01-02 NOTE — Op Note (Signed)
[facility] Patient Name: Patricia Haynes Procedure Date: 01/02/2023 9:26 AM MRN: [id number] Account #: [id number] Date of Birth: 1945-03-27 Admit Type: Outpatient Age: 77 Room: [facility] ENDO ROOM 2 Gender: Female Note Status: Finalized Instrument Name: Prentice Docker [id number] Procedure:             Colonoscopy Indications:           Disease activity assessment of chronic ulcerative                         proctosigmoiditis Providers:             Boykin Nearing. [name] MD, MD Referring MD:          Danella Penton, MD (Referring MD) Medicines:             Propofol per Anesthesia Complications:         No immediate complications. Estimated blood loss:                         Minimal. Procedure:             Pre-Anesthesia Assessment:                        - The risks and benefits of the procedure and the                         sedation options and risks were discussed with the                         patient. All questions were answered and informed                         consent was obtained.                        - Patient identification and proposed procedure were                         verified prior to the procedure by the nurse. The                         procedure was verified in the procedure room.                        - ASA Grade Assessment: III - A patient with severe                         systemic disease.                        - After reviewing the risks and benefits, the patient                         was deemed in satisfactory condition to undergo the                         procedure.                        After obtaining informed consent, the colonoscope was  passed under direct vision. Throughout the procedure,                         the patient's blood pressure, pulse, and oxygen                         saturations were monitored continuously. The                         Colonoscope was introduced through  the anus and                         advanced to the the cecum, identified by appendiceal                         orifice and ileocecal valve. The colonoscopy was                         somewhat difficult due to restricted mobility of the                         colon and significant looping. Successful completion                         of the procedure was aided by applying abdominal                         pressure. The patient tolerated the procedure well.                         The quality of the bowel preparation was good. The                         ileocecal valve, appendiceal orifice, and rectum were                         photographed. Findings:      The perianal and digital rectal examinations were normal. Pertinent       negatives include normal sphincter tone and no palpable rectal lesions.      The colon (entire examined portion) appeared normal. Four biopsies were       obtained right colon, left colon and rectum with cold forceps for       surveillance. Estimated blood loss was minimal.      The retroflexed view of the distal rectum and anal verge was normal and       showed no anal or rectal abnormalities. Impression:            - The entire examined colon is normal.                        - The distal rectum and anal verge are normal on                         retroflexion view.                        - Four biopsies were obtained right colon, left colon  and rectum. Recommendation:        - Patient has a contact number available for                         emergencies. The signs and symptoms of potential                         delayed complications were discussed with the patient.                         Return to normal activities tomorrow. Written                         discharge instructions were provided to the patient.                        - Resume previous diet.                        - Continue present medications.                         - Await pathology results.                        - No repeat colonoscopy due to current age (58 years                         or older) and no evidence of mucosal or other                         abnormalities on today's exam.                        - Return to GI office in 3 months.                        - Address concerns for fecal incontinence                        - You do NOT require further colon cancer screening                         measures (Annual stool testing (i.e. hemoccult, FIT,                         cologuard), sigmoidoscopy, colonoscopy or CT                         colonography). You should share this recommendation                         with your Primary Care provider.                        - The findings and recommendations were discussed with                         the patient. Procedure Code(s):     --- Professional ---  01093, Colonoscopy, flexible; with biopsy, single or                         multiple Diagnosis Code(s):     --- Professional ---                        K51.30, Ulcerative (chronic) rectosigmoiditis without                         complications CPT copyright 2022 American Medical Association. All rights reserved. The codes documented in this report are preliminary and upon coder review may  be revised to meet current compliance requirements. Stanton Kidney MD, MD 01/02/2023 10:06:44 AM This report has been signed electronically. Number of Addenda: 0 Note Initiated On: 01/02/2023 9:26 AM Scope Withdrawal Time: 0 hours 6 minutes 24 seconds  Total Procedure Duration: 0 hours 13 minutes 8 seconds  Estimated Blood Loss:  Estimated blood loss was minimal.      [facility]

## 2023-01-02 NOTE — Interval H&P Note (Signed)
History and Physical Interval Note:  01/02/2023 9:29 AM  Patricia Haynes  has presented today for surgery, with the diagnosis of 229.9 (ICD-9-CM) - D36.9 (ICD-10-CM) - Tubular adenoma 556.3 (ICD-9-CM) - K51.30 (ICD-10-CM) - Ulcerative rectosigmoiditis without complication (CMS/HHS-HCC).  The various methods of treatment have been discussed with the patient and family. After consideration of risks, benefits and other options for treatment, the patient has consented to  Procedure(s): COLONOSCOPY WITH PROPOFOL (N/A) as a surgical intervention.  The patient's history has been reviewed, patient examined, no change in status, stable for surgery.  I have reviewed the patient's chart and labs.  Questions were answered to the patient's satisfaction.     [place], [place]

## 2023-01-02 NOTE — Anesthesia Preprocedure Evaluation (Signed)
Anesthesia Evaluation  Patient identified by MRN, date of birth, ID band Patient awake    Reviewed: Allergy & Precautions, NPO status , Patient's Chart, lab work & pertinent test results  Airway Mallampati: II  TM Distance: >3 FB Neck ROM: Full    Dental  (+) Teeth Intact   Pulmonary neg pulmonary ROS, sleep apnea , Patient abstained from smoking., former smoker   Pulmonary exam normal breath sounds clear to auscultation       Cardiovascular Exercise Tolerance: Good hypertension, Pt. on medications negative cardio ROS Normal cardiovascular exam Rhythm:Regular Rate:Normal     Neuro/Psych negative neurological ROS  negative psych ROS   GI/Hepatic negative GI ROS, Neg liver ROS, PUD,GERD  Medicated,,  Endo/Other  negative endocrine ROS    Renal/GU negative Renal ROS  negative genitourinary   Musculoskeletal   Abdominal Normal abdominal exam  (+)   Peds negative pediatric ROS (+)  Hematology negative hematology ROS (+)   Anesthesia Other Findings Past Medical History: No date: Allergic state No date: B12 deficiency No date: Cancer [facility])     Comment:  skin No date: GERD (gastroesophageal reflux disease) No date: Hyperlipidemia No date: Hypertension No date: Reflux esophagitis No date: Sleep apnea     Comment:  developed cough when using cpap and not using No date: Ulcerative colitis [facility])  Past Surgical History: No date: ABDOMINAL HYSTERECTOMY No date: CATARACT EXTRACTION No date: COLONOSCOPY 01/18/2017: COLONOSCOPY WITH PROPOFOL; N/A     Comment:  Procedure: COLONOSCOPY WITH PROPOFOL;  Surgeon: Scot Jun, MD;  Location: [facility] ENDOSCOPY;  Service:               Endoscopy;  Laterality: N/A; No date: ESOPHAGOGASTRODUODENOSCOPY No date: NASAL SINUS SURGERY No date: POLYPECTOMY No date: SIGMOIDOSCOPY  BMI    Body Mass Index: 27.42 kg/m      Reproductive/Obstetrics negative OB  ROS                             Anesthesia Physical Anesthesia Plan  ASA: 2  Anesthesia Plan: General   Post-op Pain Management:    Induction: Intravenous  PONV Risk Score and Plan: Propofol infusion and TIVA  Airway Management Planned: Natural Airway and Nasal Cannula  Additional Equipment:   Intra-op Plan:   Post-operative Plan:   Informed Consent: I have reviewed the patients History and Physical, chart, labs and discussed the procedure including the risks, benefits and alternatives for the proposed anesthesia with the patient or authorized representative who has indicated his/her understanding and acceptance.     Dental Advisory Given  Plan Discussed with: CRNA and Surgeon  Anesthesia Plan Comments:        Anesthesia Quick Evaluation

## 2023-01-03 ENCOUNTER — Encounter: Payer: Self-pay | Admitting: Internal Medicine

## 2023-01-03 LAB — SURGICAL PATHOLOGY

## 2023-04-01 ENCOUNTER — Ambulatory Visit
Admission: EM | Admit: 2023-04-01 | Discharge: 2023-04-01 | Disposition: A | Discharge status: Home / Self Care | Payer: PPO

## 2023-04-01 ENCOUNTER — Ambulatory Visit (INDEPENDENT_AMBULATORY_CARE_PROVIDER_SITE_OTHER): Payer: PPO

## 2023-04-01 DIAGNOSIS — L03113 Cellulitis of right upper limb: Secondary | ICD-10-CM | POA: Diagnosis not present

## 2023-04-01 DIAGNOSIS — M25521 Pain in right elbow: Secondary | ICD-10-CM | POA: Diagnosis not present

## 2023-04-01 DIAGNOSIS — T148XXA Other injury of unspecified body region, initial encounter: Secondary | ICD-10-CM | POA: Diagnosis not present

## 2023-04-01 DIAGNOSIS — L089 Local infection of the skin and subcutaneous tissue, unspecified: Secondary | ICD-10-CM

## 2023-04-01 DIAGNOSIS — M8580 Other specified disorders of bone density and structure, unspecified site: Secondary | ICD-10-CM | POA: Diagnosis not present

## 2023-04-01 MED ORDER — CEPHALEXIN 500 MG PO CAPS: 500.0000 mg | ORAL_CAPSULE | Freq: Four times a day (QID) | ORAL | 0 refills | Status: AC

## 2023-04-01 NOTE — Discharge Instructions (Addendum)
Take the Keflex as directed.  Follow up with your primary care provider tomorrow.  Go to the emergency department if you have worsening symptoms.

## 2023-04-01 NOTE — ED Triage Notes (Signed)
Patient to Urgent Care with complaints of right sided elbow pain following a fall that occurred 11 days ago. Patient with a weeping/ oozing wound present to her elbow. Has been keeping the area covered with a bandaid.  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

## 2023-04-01 NOTE — ED Provider Notes (Signed)
Patricia Haynes    CSN: [id number] Arrival date & time: 04/01/23  1115      History   Chief Complaint Chief Complaint  Patient presents with   Arm Injury    HPI Patricia Haynes is a 78 y.o. female.  Patient presents with a wound on her right elbow accompanied by pain and swelling after she fell on 03/21/2023.  She has been treating the wound with antibiotic ointment and a Band-Aid.  She is concerned for infection.  No fever or purulent drainage.  Last tetanus 12/25/2018.    The history is provided by the patient and medical records.    Past Medical History:  Diagnosis Date   Allergic state    B12 deficiency    Cancer (HCC)    skin   GERD (gastroesophageal reflux disease)    Hyperlipidemia    Hypertension    Reflux esophagitis    Sleep apnea    developed cough when using cpap and not using   Ulcerative colitis [facility])     Patient Active Problem List   Diagnosis Date Noted   Pain due to onychomycosis of toenails of both feet 02/27/2021   Hammer toe of second toe of left foot 01/26/2019   Tubular adenoma 02/14/2017   Benign essential hypertension 12/24/2016   OSA on CPAP 11/09/2016   Medicare annual wellness visit, initial 09/07/2016   Primary osteoarthritis of left knee 09/04/2016   Hyperlipidemia, mixed 08/16/2016   B12 deficiency 02/11/2014   Gastro-esophageal reflux disease with esophagitis 07/31/2013   Ulcerative colitis (HCC) 07/31/2013    Past Surgical History:  Procedure Laterality Date   ABDOMINAL HYSTERECTOMY     BIOPSY  01/02/2023   Procedure: BIOPSY;  Surgeon: [name], Boykin Nearing, MD;  Location: [facility] ENDOSCOPY;  Service: Gastroenterology;;   CATARACT EXTRACTION     COLONOSCOPY     COLONOSCOPY WITH PROPOFOL N/A 01/18/2017   Procedure: COLONOSCOPY WITH PROPOFOL;  Surgeon: Scot Jun, MD;  Location: [facility] ENDOSCOPY;  Service: Endoscopy;  Laterality: N/A;   COLONOSCOPY WITH PROPOFOL N/A 01/02/2023   Procedure: COLONOSCOPY WITH PROPOFOL;   Surgeon: Toledo, Boykin Nearing, MD;  Location: [facility] ENDOSCOPY;  Service: Gastroenterology;  Laterality: N/A;   ESOPHAGOGASTRODUODENOSCOPY     NASAL SINUS SURGERY     POLYPECTOMY     SIGMOIDOSCOPY      OB History   No obstetric history on file.      Home Medications    Prior to Admission medications   Medication Sig Start Date End Date Taking? Authorizing Provider  cephALEXin (KEFLEX) 500 MG capsule Take 1 capsule (500 mg total) by mouth 4 (four) times daily. 04/01/23  Yes Mickie Bail, NP  donepezil (ARICEPT) 5 MG tablet Take 1 tablet by mouth at bedtime. 11/16/22 11/16/23 Yes [provider]  ALPRAZolam Prudy Feeler) 0.5 MG tablet Take 0.5 mg at bedtime as needed by mouth for anxiety.    [provider]  amLODipine (NORVASC) 5 MG tablet Take 5 mg daily by mouth.    [provider]  aspirin EC 81 MG tablet Take 81 mg daily by mouth.    [provider]  azelastine (ASTELIN) 0.1 % nasal spray Place 2 (two) times daily into both nostrils. Use in each nostril as directed    [provider]  Cholecalciferol 2000 units TABS Take 2,000 Units by mouth.    [provider]  Cyanocobalamin (VITAMIN B-12) 2500 MCG SUBL Place under the tongue.    [provider]  doxazosin (CARDURA) 4 MG tablet  10/28/17   [provider]  loratadine (CLARITIN) 10 MG tablet Take 10 mg daily by mouth.    [provider]  meloxicam (MOBIC) 7.5 MG tablet  10/08/17   [provider]  ofloxacin (OCUFLOX) 0.3 % ophthalmic solution  10/23/18   [provider]  Omega-3 Fatty Acids (FISH OIL PO) Take by mouth.    [provider]  omeprazole (PRILOSEC) 40 MG capsule Take 40 mg daily by mouth.    [provider]  pravastatin (PRAVACHOL) 20 MG tablet Take 20 mg daily by mouth. Patient not taking: Reported on 01/02/2023    [provider]  prednisoLONE acetate (PRED FORTE) 1 % ophthalmic suspension  10/23/18   [provider]  rosuvastatin (CRESTOR) 5 MG tablet  10/10/18   [provider]  spironolactone (ALDACTONE) 50 MG tablet  10/08/17   [provider]    Family History Family History  Problem Relation Age of Onset   Breast cancer Neg Hx     Social History Social History   Tobacco Use   Smoking status: Former   Smokeless tobacco: Never  Advertising account planner   Vaping status: Never Used  Substance Use Topics   Alcohol use: No   Drug use: No     Allergies   Codeine, Etodolac, and Latex   Review of Systems Review of Systems  Constitutional:  Negative for chills and fever.  Musculoskeletal:  Positive for arthralgias and joint swelling.  Skin:  Positive for color change and wound.  Neurological:  Negative for weakness and numbness.     Physical Exam Triage Vital Signs ED Triage Vitals  Encounter Vitals Group     BP --      Systolic BP Percentile --      Diastolic BP Percentile --      Pulse Rate 04/01/23 1136 73     Resp 04/01/23 1136 18     Temp 04/01/23 1136 97.8 F (36.6 C)     Temp src --      SpO2 04/01/23 1136 95 %     Weight --      Height --      Head Circumference --      Peak Flow --      Pain Score 04/01/23 1143 3     Pain Loc --      Pain Education --      Exclude from Growth Chart --    No data found.  Updated Vital Signs BP (!) 132/56   Pulse 73   Temp 97.8 F (36.6 C)   Resp 18   SpO2 95%   Visual Acuity Right Eye Distance:   Left Eye Distance:   Bilateral Distance:    Right Eye Near:   Left Eye Near:    Bilateral Near:     Physical Exam Constitutional:      General: She is not in acute distress. HENT:     Mouth/Throat:     Mouth: Mucous membranes are moist.  Cardiovascular:     Rate and Rhythm: Normal rate and regular rhythm.  Pulmonary:     Effort: Pulmonary effort is normal. No respiratory distress.  Musculoskeletal:        General: Swelling present. No deformity. Normal range of motion.  Skin:    General: Skin  is warm and dry.     Findings: Erythema and lesion present.     Comments: Wound on right elbow. See  picture.   Neurological:     Mental Status: She is alert.     Sensory: No sensory deficit.     Motor: No weakness.      UC Treatments / Results  Labs (all labs ordered are listed, but only abnormal results are displayed) Labs Reviewed - No data to display  EKG   Radiology DG Elbow Complete Right Result Date: 04/01/2023 CLINICAL DATA:  Pain after fall EXAM: RIGHT ELBOW - COMPLETE 4 VIEW COMPARISON:  None Available. FINDINGS: No fracture or dislocation. Preserved joint spaces. No joint effusion on lateral view. Osteopenia. IMPRESSION: No acute osseous abnormality.  Osteopenia Electronically Signed   By: Karen Kays M.D.   On: 04/01/2023 12:06    Procedures Procedures (including critical care time)  Medications Ordered in UC Medications - No data to display  Initial Impression / Assessment and Plan / UC Course  I have reviewed the triage vital signs and the nursing notes.  Pertinent labs & imaging results that were available during my care of the patient were reviewed by me and considered in my medical decision making (see chart for details).    Right elbow pain, infection wound, cellulitis of right elbow.  Xray negative for acute abnormality.  Treating with Keflex. (GFR 75 in August 2024)  Instructed patient to follow up with her PCP tomorrow.  ED precautions discussed.  Education provided on cellulitis.  She agrees to plan of care.    Final Clinical Impressions(s) / UC Diagnoses   Final diagnoses:  Right elbow pain  Infected wound  Cellulitis of right elbow     Discharge Instructions      Take the Keflex as directed.  Follow up with your primary care provider tomorrow.  Go to the emergency department if you have worsening symptoms.        ED Prescriptions     Medication Sig Dispense Auth. Provider   cephALEXin (KEFLEX) 500 MG capsule Take 1 capsule (500 mg  total) by mouth 4 (four) times daily. 28 capsule Mickie Bail, NP      PDMP not reviewed this encounter.   Mickie Bail, NP 04/01/23 1213

## 2023-04-02 DIAGNOSIS — R4189 Other symptoms and signs involving cognitive functions and awareness: Secondary | ICD-10-CM | POA: Diagnosis not present

## 2023-04-02 DIAGNOSIS — L03113 Cellulitis of right upper limb: Secondary | ICD-10-CM | POA: Diagnosis not present

## 2023-04-02 DIAGNOSIS — I499 Cardiac arrhythmia, unspecified: Secondary | ICD-10-CM | POA: Diagnosis not present

## 2023-04-02 DIAGNOSIS — R55 Syncope and collapse: Secondary | ICD-10-CM | POA: Diagnosis not present

## 2023-04-02 DIAGNOSIS — G3184 Mild cognitive impairment, so stated: Secondary | ICD-10-CM | POA: Diagnosis not present

## 2023-04-04 ENCOUNTER — Other Ambulatory Visit: Payer: Self-pay | Admitting: Internal Medicine

## 2023-04-04 DIAGNOSIS — R55 Syncope and collapse: Secondary | ICD-10-CM

## 2023-04-04 DIAGNOSIS — L03113 Cellulitis of right upper limb: Secondary | ICD-10-CM

## 2023-04-04 DIAGNOSIS — R4189 Other symptoms and signs involving cognitive functions and awareness: Secondary | ICD-10-CM

## 2023-04-12 ENCOUNTER — Ambulatory Visit
Admission: RE | Admit: 2023-04-12 | Discharge: 2023-04-12 | Disposition: A | Discharge status: Home / Self Care | Payer: PPO | Source: Ambulatory Visit | Attending: Internal Medicine | Admitting: Internal Medicine

## 2023-04-12 DIAGNOSIS — R55 Syncope and collapse: Secondary | ICD-10-CM | POA: Insufficient documentation

## 2023-04-12 DIAGNOSIS — R42 Dizziness and giddiness: Secondary | ICD-10-CM | POA: Diagnosis not present

## 2023-04-12 DIAGNOSIS — L03113 Cellulitis of right upper limb: Secondary | ICD-10-CM | POA: Insufficient documentation

## 2023-04-12 DIAGNOSIS — I6381 Other cerebral infarction due to occlusion or stenosis of small artery: Secondary | ICD-10-CM | POA: Diagnosis not present

## 2023-04-12 DIAGNOSIS — R4189 Other symptoms and signs involving cognitive functions and awareness: Secondary | ICD-10-CM | POA: Insufficient documentation

## 2023-04-26 DIAGNOSIS — R55 Syncope and collapse: Secondary | ICD-10-CM | POA: Diagnosis not present

## 2023-04-26 DIAGNOSIS — I499 Cardiac arrhythmia, unspecified: Secondary | ICD-10-CM | POA: Diagnosis not present

## 2023-05-16 DIAGNOSIS — I10 Essential (primary) hypertension: Secondary | ICD-10-CM | POA: Diagnosis not present

## 2023-05-16 DIAGNOSIS — M7912 Myalgia of auxiliary muscles, head and neck: Secondary | ICD-10-CM | POA: Diagnosis not present

## 2023-05-16 DIAGNOSIS — E782 Mixed hyperlipidemia: Secondary | ICD-10-CM | POA: Diagnosis not present

## 2023-05-16 DIAGNOSIS — F330 Major depressive disorder, recurrent, mild: Secondary | ICD-10-CM | POA: Diagnosis not present

## 2023-05-16 DIAGNOSIS — E538 Deficiency of other specified B group vitamins: Secondary | ICD-10-CM | POA: Diagnosis not present

## 2023-05-16 DIAGNOSIS — M5010 Cervical disc disorder with radiculopathy, unspecified cervical region: Secondary | ICD-10-CM | POA: Diagnosis not present

## 2023-07-30 ENCOUNTER — Ambulatory Visit
Admission: EM | Admit: 2023-07-30 | Discharge: 2023-07-30 | Disposition: A | Discharge status: Home / Self Care | Attending: Emergency Medicine | Admitting: Emergency Medicine

## 2023-07-30 DIAGNOSIS — J069 Acute upper respiratory infection, unspecified: Secondary | ICD-10-CM

## 2023-07-30 MED ORDER — AZITHROMYCIN 250 MG PO TABS: 250.0000 mg | ORAL_TABLET | Freq: Every day | ORAL | 0 refills | Status: AC

## 2023-07-30 MED ORDER — HYDROCODONE BIT-HOMATROP MBR 5-1.5 MG/5ML PO SOLN: 5.0000 mL | Freq: Four times a day (QID) | ORAL | 0 refills | Status: AC | PRN

## 2023-07-30 NOTE — ED Triage Notes (Signed)
 Patient presents to UC for cough, runny nose, nasal congestion x 1 week. Seen in April at PCP prscribed cough meds with no relief. Treating with Claritin and mucinex.

## 2023-07-30 NOTE — ED Provider Notes (Signed)
 Arlander Bellman    CSN: [id number] Arrival date & time: 07/30/23  1357      History   Chief Complaint Chief Complaint  Patient presents with   Cough    HPI Patricia Haynes is a 78 y.o. female.   Patient presents for evaluation of nasal congestion and rhinorrhea primarily to the right side present for 1 month, began to experience a productive cough over the past 7 days.  Endorses a crackling to whistling sound over her last night.  Had 1 occurrence of diarrhea which has resolved.  Has attempted use of Tussionex which has been ineffective.  Denies fever, shortness of breath or wheezing.   Past Medical History:  Diagnosis Date   Allergic state    B12 deficiency    Cancer (HCC)    skin   GERD (gastroesophageal reflux disease)    Hyperlipidemia    Hypertension    Reflux esophagitis    Sleep apnea    developed cough when using cpap and not using   Ulcerative colitis [facility])     Patient Active Problem List   Diagnosis Date Noted   Pain due to onychomycosis of toenails of both feet 02/27/2021   Hammer toe of second toe of left foot 01/26/2019   Tubular adenoma 02/14/2017   Benign essential hypertension 12/24/2016   OSA on CPAP 11/09/2016   Medicare annual wellness visit, initial 09/07/2016   Primary osteoarthritis of left knee 09/04/2016   Hyperlipidemia, mixed 08/16/2016   B12 deficiency 02/11/2014   Gastro-esophageal reflux disease with esophagitis 07/31/2013   Ulcerative colitis (HCC) 07/31/2013    Past Surgical History:  Procedure Laterality Date   ABDOMINAL HYSTERECTOMY     BIOPSY  01/02/2023   Procedure: BIOPSY;  Surgeon: Corky Diener, Alphonsus Jeans, MD;  Location: [facility] ENDOSCOPY;  Service: Gastroenterology;;   CATARACT EXTRACTION     COLONOSCOPY     COLONOSCOPY WITH PROPOFOL  N/A 01/18/2017   Procedure: COLONOSCOPY WITH PROPOFOL ;  Surgeon: Cassie Click, MD;  Location: [facility] ENDOSCOPY;  Service: Endoscopy;  Laterality: N/A;   COLONOSCOPY WITH PROPOFOL  N/A  01/02/2023   Procedure: COLONOSCOPY WITH PROPOFOL ;  Surgeon: Toledo, Alphonsus Jeans, MD;  Location: [facility] ENDOSCOPY;  Service: Gastroenterology;  Laterality: N/A;   ESOPHAGOGASTRODUODENOSCOPY     NASAL SINUS SURGERY     POLYPECTOMY     SIGMOIDOSCOPY      OB History   No obstetric history on file.      Home Medications    Prior to Admission medications   Medication Sig Start Date End Date Taking? Authorizing Provider  azithromycin (ZITHROMAX) 250 MG tablet Take 1 tablet (250 mg total) by mouth daily. Take first 2 tablets together, then 1 every day until finished. 07/30/23  Yes Andy Allende R, NP  HYDROcodone bit-homatropine (HYCODAN) 5-1.5 MG/5ML syrup Take 5 mLs by mouth every 6 (six) hours as needed for cough. 07/30/23  Yes Niklaus Mamaril, Maybelle Spatz, NP  ALPRAZolam (XANAX) 0.5 MG tablet Take 0.5 mg at bedtime as needed by mouth for anxiety.    [provider]  amLODipine (NORVASC) 5 MG tablet Take 5 mg daily by mouth.    [provider]  aspirin EC 81 MG tablet Take 81 mg daily by mouth.    [provider]  azelastine (ASTELIN) 0.1 % nasal spray Place 2 (two) times daily into both nostrils. Use in each nostril as directed    [provider]  cephALEXin  (KEFLEX ) 500 MG capsule Take 1 capsule (500 mg total) by  mouth 4 (four) times daily. 04/01/23   Wellington Half, NP  Cholecalciferol 2000 units TABS Take 2,000 Units by mouth.    [provider]  Cyanocobalamin  (VITAMIN B-12) 2500 MCG SUBL Place under the tongue.    [provider]  donepezil (ARICEPT) 5 MG tablet Take 1 tablet by mouth at bedtime. 11/16/22 11/16/23  [provider]  doxazosin (CARDURA) 4 MG tablet  10/28/17   [provider]  loratadine (CLARITIN) 10 MG tablet Take 10 mg daily by mouth.    [provider]  meloxicam (MOBIC) 7.5 MG tablet  10/08/17   [provider]  ofloxacin (OCUFLOX) 0.3 % ophthalmic solution  10/23/18   [provider]   Omega-3 Fatty Acids (FISH OIL PO) Take by mouth.    [provider]  omeprazole (PRILOSEC) 40 MG capsule Take 40 mg daily by mouth.    [provider]  pravastatin (PRAVACHOL) 20 MG tablet Take 20 mg daily by mouth. Patient not taking: Reported on 01/02/2023    [provider]  prednisoLONE acetate (PRED FORTE) 1 % ophthalmic suspension  10/23/18   [provider]  rosuvastatin (CRESTOR) 5 MG tablet  10/10/18   [provider]  spironolactone (ALDACTONE) 50 MG tablet  10/08/17   [provider]    Family History Family History  Problem Relation Age of Onset   Breast cancer Neg Hx     Social History Social History   Tobacco Use   Smoking status: Former   Smokeless tobacco: Never  Vaping Use   Vaping status: Never Used  Substance Use Topics   Alcohol use: No   Drug use: No     Allergies   Codeine, Etodolac, and Latex   Review of Systems Review of Systems   Physical Exam Triage Vital Signs ED Triage Vitals  Encounter Vitals Group     BP 07/30/23 1418 (!) 169/63     Systolic BP Percentile --      Diastolic BP Percentile --      Pulse Rate 07/30/23 1418 66     Resp 07/30/23 1418 17     Temp 07/30/23 1418 98.2 F (36.8 C)     Temp Source 07/30/23 1418 Oral     SpO2 07/30/23 1418 95 %     Weight --      Height --      Head Circumference --      Peak Flow --      Pain Score 07/30/23 1415 0     Pain Loc --      Pain Education --      Exclude from Growth Chart --    No data found.  Updated Vital Signs BP (!) 169/63 (BP Location: Left Arm) Comment: Pt had 2 cups of coffee earlier.  Pulse 66   Temp 98.2 F (36.8 C) (Oral)   Resp 17   SpO2 95%   Visual Acuity Right Eye Distance:   Left Eye Distance:   Bilateral Distance:    Right Eye Near:   Left Eye Near:    Bilateral Near:     Physical Exam Constitutional:      Appearance: Normal appearance.  HENT:     Head: Normocephalic.     Right Ear:  Tympanic membrane, ear canal and external ear normal.     Left Ear: Tympanic membrane, ear canal and external ear normal.     Nose: Congestion present.     Mouth/Throat:  Mouth: Mucous membranes are moist.     Pharynx: Oropharynx is clear.  Eyes:     Extraocular Movements: Extraocular movements intact.  Cardiovascular:     Rate and Rhythm: Normal rate and regular rhythm.     Pulses: Normal pulses.     Heart sounds: Normal heart sounds.  Pulmonary:     Effort: Pulmonary effort is normal.     Breath sounds: Normal breath sounds.  Musculoskeletal:     Cervical back: Normal range of motion and neck supple.  Neurological:     Mental Status: She is alert and oriented to person, place, and time. Mental status is at baseline.      UC Treatments / Results  Labs (all labs ordered are listed, but only abnormal results are displayed) Labs Reviewed - No data to display  EKG   Radiology No results found.  Procedures Procedures (including critical care time)  Medications Ordered in UC Medications - No data to display  Initial Impression / Assessment and Plan / UC Course  I have reviewed the triage vital signs and the nursing notes.  Pertinent labs & imaging results that were available during my care of the patient were reviewed by me and considered in my medical decision making (see chart for details).  Acute URI  Patient is in no signs of distress nor toxic appearing.  Vital signs are stable.  Low suspicion for pneumonia, pneumothorax or bronchitis and therefore will defer imaging.  Viral testing deferred due to timeline of illness.  Symptoms present for 7 days without resolution, empirically placed on azithromycin for and prescribed Hycodan, has used in the past with success, PDMP reviewed, low risk.May use additional over-the-counter medications as needed for supportive care.  May follow-up with urgent care as needed if symptoms persist or worsen.  Final Clinical  Impressions(s) / UC Diagnoses   Final diagnoses:  Acute URI   Discharge Instructions      Begin azithromycin for coverage of bacteria  You may use cough syrup as needed, be mindful this will make you drowsy    You can take Tylenol and/or Ibuprofen as needed for fever reduction and pain relief.   For cough: honey 1/2 to 1 teaspoon (you can dilute the honey in water  or another fluid).  You can also use guaifenesin and dextromethorphan for cough. You can use a humidifier for chest congestion and cough.  If you don't have a humidifier, you can sit in the bathroom with the hot shower running.      For sore throat: try warm salt water  gargles, cepacol lozenges, throat spray, warm tea or water  with lemon/honey, popsicles or ice, or OTC cold relief medicine for throat discomfort.   For congestion: take a daily anti-histamine like Zyrtec, Claritin, and a oral decongestant, such as pseudoephedrine.  You can also use Flonase 1-2 sprays in each nostril daily.   It is important to stay hydrated: drink plenty of fluids (water , gatorade/powerade/pedialyte, juices, or teas) to keep your throat moisturized and help further relieve irritation/discomfort.   ED Prescriptions     Medication Sig Dispense Auth. Provider   azithromycin (ZITHROMAX) 250 MG tablet Take 1 tablet (250 mg total) by mouth daily. Take first 2 tablets together, then 1 every day until finished. 6 tablet Forbes Loll R, NP   HYDROcodone bit-homatropine (HYCODAN) 5-1.5 MG/5ML syrup Take 5 mLs by mouth every 6 (six) hours as needed for cough. 120 mL Reena Canning, NP      PDMP not reviewed  this encounter.   Reena Canning, NP 07/30/23 1435

## 2023-07-30 NOTE — Discharge Instructions (Addendum)
 Begin azithromycin for coverage of bacteria  You may use cough syrup as needed, be mindful this will make you drowsy    You can take Tylenol and/or Ibuprofen as needed for fever reduction and pain relief.   For cough: honey 1/2 to 1 teaspoon (you can dilute the honey in water  or another fluid).  You can also use guaifenesin and dextromethorphan for cough. You can use a humidifier for chest congestion and cough.  If you don't have a humidifier, you can sit in the bathroom with the hot shower running.      For sore throat: try warm salt water  gargles, cepacol lozenges, throat spray, warm tea or water  with lemon/honey, popsicles or ice, or OTC cold relief medicine for throat discomfort.   For congestion: take a daily anti-histamine like Zyrtec, Claritin, and a oral decongestant, such as pseudoephedrine.  You can also use Flonase 1-2 sprays in each nostril daily.   It is important to stay hydrated: drink plenty of fluids (water , gatorade/powerade/pedialyte, juices, or teas) to keep your throat moisturized and help further relieve irritation/discomfort.

## 2023-09-27 DIAGNOSIS — H02886 Meibomian gland dysfunction of left eye, unspecified eyelid: Secondary | ICD-10-CM | POA: Diagnosis not present

## 2023-09-27 DIAGNOSIS — H16223 Keratoconjunctivitis sicca, not specified as Sjogren's, bilateral: Secondary | ICD-10-CM | POA: Diagnosis not present

## 2023-09-27 DIAGNOSIS — Z961 Presence of intraocular lens: Secondary | ICD-10-CM | POA: Diagnosis not present

## 2023-09-27 DIAGNOSIS — H02883 Meibomian gland dysfunction of right eye, unspecified eyelid: Secondary | ICD-10-CM | POA: Diagnosis not present

## 2023-09-27 DIAGNOSIS — H5213 Myopia, bilateral: Secondary | ICD-10-CM | POA: Diagnosis not present

## 2023-09-28 DIAGNOSIS — M1712 Unilateral primary osteoarthritis, left knee: Secondary | ICD-10-CM | POA: Diagnosis not present

## 2023-10-14 DIAGNOSIS — M1712 Unilateral primary osteoarthritis, left knee: Secondary | ICD-10-CM | POA: Diagnosis not present

## 2023-11-15 DIAGNOSIS — R739 Hyperglycemia, unspecified: Secondary | ICD-10-CM | POA: Diagnosis not present

## 2023-11-15 DIAGNOSIS — E782 Mixed hyperlipidemia: Secondary | ICD-10-CM | POA: Diagnosis not present

## 2023-11-15 DIAGNOSIS — E538 Deficiency of other specified B group vitamins: Secondary | ICD-10-CM | POA: Diagnosis not present

## 2023-11-25 DIAGNOSIS — G4733 Obstructive sleep apnea (adult) (pediatric): Secondary | ICD-10-CM | POA: Diagnosis not present

## 2023-11-25 DIAGNOSIS — E782 Mixed hyperlipidemia: Secondary | ICD-10-CM | POA: Diagnosis not present

## 2023-11-25 DIAGNOSIS — R739 Hyperglycemia, unspecified: Secondary | ICD-10-CM | POA: Diagnosis not present

## 2023-11-25 DIAGNOSIS — F330 Major depressive disorder, recurrent, mild: Secondary | ICD-10-CM | POA: Diagnosis not present

## 2023-11-25 DIAGNOSIS — E538 Deficiency of other specified B group vitamins: Secondary | ICD-10-CM | POA: Diagnosis not present

## 2023-11-25 DIAGNOSIS — F339 Major depressive disorder, recurrent, unspecified: Secondary | ICD-10-CM | POA: Diagnosis not present

## 2023-11-25 DIAGNOSIS — Z0000 Encounter for general adult medical examination without abnormal findings: Secondary | ICD-10-CM | POA: Diagnosis not present

## 2023-11-25 DIAGNOSIS — M170 Bilateral primary osteoarthritis of knee: Secondary | ICD-10-CM | POA: Diagnosis not present

## 2023-12-10 DIAGNOSIS — M7918 Myalgia, other site: Secondary | ICD-10-CM | POA: Diagnosis not present

## 2023-12-10 DIAGNOSIS — I10 Essential (primary) hypertension: Secondary | ICD-10-CM | POA: Diagnosis not present

## 2023-12-10 DIAGNOSIS — M5116 Intervertebral disc disorders with radiculopathy, lumbar region: Secondary | ICD-10-CM | POA: Diagnosis not present

## 2024-01-23 DIAGNOSIS — M1712 Unilateral primary osteoarthritis, left knee: Secondary | ICD-10-CM | POA: Diagnosis not present

## 2024-01-23 DIAGNOSIS — M1711 Unilateral primary osteoarthritis, right knee: Secondary | ICD-10-CM | POA: Diagnosis not present

## 2024-02-11 DIAGNOSIS — M25461 Effusion, right knee: Secondary | ICD-10-CM | POA: Diagnosis not present

## 2024-02-11 DIAGNOSIS — G8929 Other chronic pain: Secondary | ICD-10-CM | POA: Diagnosis not present

## 2024-02-11 DIAGNOSIS — M25462 Effusion, left knee: Secondary | ICD-10-CM | POA: Diagnosis not present

## 2024-02-11 DIAGNOSIS — M170 Bilateral primary osteoarthritis of knee: Secondary | ICD-10-CM | POA: Diagnosis not present
